# Patient Record
Sex: Female | Born: 1940 | Race: White | Hispanic: No | Smoking: Never smoker
Health system: Southern US, Community
[De-identification: ages and names within clinical notes are randomized; demographics above are authoritative.]

## PROBLEM LIST (undated history)

## (undated) DIAGNOSIS — I4891 Unspecified atrial fibrillation: Secondary | ICD-10-CM

## (undated) DIAGNOSIS — J189 Pneumonia, unspecified organism: Secondary | ICD-10-CM

## (undated) DIAGNOSIS — I499 Cardiac arrhythmia, unspecified: Secondary | ICD-10-CM

## (undated) DIAGNOSIS — I10 Essential (primary) hypertension: Secondary | ICD-10-CM

## (undated) DIAGNOSIS — J45909 Unspecified asthma, uncomplicated: Secondary | ICD-10-CM

---

## 2014-05-26 ENCOUNTER — Emergency Department (HOSPITAL_COMMUNITY)
Admission: EM | Admit: 2014-05-26 | Discharge: 2014-05-26 | Disposition: A | Discharge status: Home / Self Care | Payer: Medicare Other | Attending: Emergency Medicine | Admitting: Emergency Medicine

## 2014-05-26 ENCOUNTER — Emergency Department (HOSPITAL_COMMUNITY): Payer: Medicare Other

## 2014-05-26 ENCOUNTER — Encounter (HOSPITAL_COMMUNITY): Payer: Self-pay | Admitting: Emergency Medicine

## 2014-05-26 DIAGNOSIS — Z79899 Other long term (current) drug therapy: Secondary | ICD-10-CM | POA: Insufficient documentation

## 2014-05-26 DIAGNOSIS — M79671 Pain in right foot: Secondary | ICD-10-CM

## 2014-05-26 DIAGNOSIS — Z880 Allergy status to penicillin: Secondary | ICD-10-CM | POA: Insufficient documentation

## 2014-05-26 DIAGNOSIS — Z7902 Long term (current) use of antithrombotics/antiplatelets: Secondary | ICD-10-CM | POA: Insufficient documentation

## 2014-05-26 DIAGNOSIS — I10 Essential (primary) hypertension: Secondary | ICD-10-CM | POA: Insufficient documentation

## 2014-05-26 DIAGNOSIS — I4891 Unspecified atrial fibrillation: Secondary | ICD-10-CM | POA: Insufficient documentation

## 2014-05-26 DIAGNOSIS — M79609 Pain in unspecified limb: Secondary | ICD-10-CM | POA: Insufficient documentation

## 2014-05-26 HISTORY — DX: Unspecified atrial fibrillation: I48.91

## 2014-05-26 HISTORY — DX: Essential (primary) hypertension: I10

## 2014-05-26 LAB — BASIC METABOLIC PANEL
BUN: 12 mg/dL (ref 6–23)
CO2: 31 mEq/L (ref 19–32)
CREATININE: 0.74 mg/dL (ref 0.50–1.10)
Calcium: 8.7 mg/dL (ref 8.4–10.5)
Chloride: 101 mEq/L (ref 96–112)
GFR, EST NON AFRICAN AMERICAN: 84 mL/min — AB (ref >90)
Glucose, Bld: 98 mg/dL (ref 70–99)
Potassium: 3.9 mEq/L (ref 3.7–5.3)
Sodium: 140 mEq/L (ref 137–147)

## 2014-05-26 LAB — CBC WITH DIFFERENTIAL/PLATELET
BASOS PCT: 0 % (ref 0–1)
Basophils Absolute: 0 10*3/uL (ref 0.0–0.1)
Eosinophils Absolute: 0.1 10*3/uL (ref 0.0–0.7)
Eosinophils Relative: 3 % (ref 0–5)
HCT: 39.8 % (ref 36.0–46.0)
Hemoglobin: 13.3 g/dL (ref 12.0–15.0)
Lymphocytes Relative: 16 % (ref 12–46)
Lymphs Abs: 0.9 10*3/uL (ref 0.7–4.0)
MCH: 31.3 pg (ref 26.0–34.0)
MCHC: 33.4 g/dL (ref 30.0–36.0)
MCV: 93.6 fL (ref 78.0–100.0)
MONO ABS: 0.8 10*3/uL (ref 0.1–1.0)
Monocytes Relative: 15 % — ABNORMAL HIGH (ref 3–12)
NEUTROS ABS: 3.6 10*3/uL (ref 1.7–7.7)
Neutrophils Relative %: 66 % (ref 43–77)
Platelets: 220 10*3/uL (ref 150–400)
RBC: 4.25 MIL/uL (ref 3.87–5.11)
RDW: 14.2 % (ref 11.5–15.5)
WBC: 5.5 10*3/uL (ref 4.0–10.5)

## 2014-05-26 MED ORDER — OXYCODONE-ACETAMINOPHEN 5-325 MG PO TABS
1.0000 | ORAL_TABLET | Freq: Once | ORAL | Status: AC
  Administered 2014-05-26: 1 via ORAL
  Filled 2014-05-26: qty 1

## 2014-05-26 MED ORDER — CLINDAMYCIN HCL 300 MG PO CAPS: 300.0000 mg | ORAL_CAPSULE | Freq: Four times a day (QID) | ORAL | Status: DC

## 2014-05-26 MED ORDER — OXYCODONE-ACETAMINOPHEN 5-325 MG PO TABS
1.0000 | ORAL_TABLET | ORAL | Status: DC | PRN
Start: 2014-05-26 — End: 2019-09-26

## 2014-05-26 NOTE — ED Notes (Addendum)
Patient states she recently drove from [place], two days total of around 20 hours of driving arrived 7 days ago. Right Leg/foot swollen since. Hx of Afib, on blood thinner.  Edema +2, nonpitting. RLE DP pulse weak, doppler confirmed.

## 2014-05-26 NOTE — ED Provider Notes (Signed)
CSN: [id number]     Arrival date & time 05/26/14  [id number] History  This chart was scribed for Joya Gaskinsonald W Wickline, MD by Julian HyMorgan Graham, ED Scribe. The patient was seen in [facility]. The patient's care was started at 10:40 AM.     Chief Complaint  Patient presents with  . Leg Swelling   Patient is a 73 y.o. female presenting with leg pain. The history is provided by the patient. No language interpreter was used.  Leg Pain Location:  Foot Time since incident:  2 days Injury: no   Foot location:  R foot Pain details:    Quality:  Unable to specify   Radiates to:  R leg   Severity:  Moderate   Onset quality:  Sudden   Duration:  2 days   Timing:  Constant Chronicity:  New Dislocation: no   Prior injury to area:  No Worsened by:  Activity and bearing weight Ineffective treatments: Taramodol  Associated symptoms: decreased ROM   Associated symptoms: no muscle weakness and no numbness    HPI Comments: Theresa Fostervelyn Bolon is a 73 y.o. female who presents to the Emergency Department complaining of constant, moderate right foot pain that started 3 days ago. Pt states her pain radiates to her right calf. Pt states her pain is worsened with movement and weight-bearing. Pt states she drove for about 13 hours from [place] to [place] last week. She has past medical history of high blood pressure and has atrial fibrillation. Pt states she tried Tramadol without relief. She currently takes Eliquis 5 MG and is compliant. Pt denies history of DVT/PE. Pt denies blood loss, blood in the stool, chest pain, numbness, weakness and SOB. Pt denies any known allergies.  No trauma reported  Past Medical History  Diagnosis Date  . Atrial fibrillation   . Hypertension    No past surgical history on file. No family history on file. History  Substance Use Topics  . Smoking status: Never Smoker   . Smokeless tobacco: Not on file  . Alcohol Use: Not on file   OB History   Grav Para Term Preterm Abortions TAB  SAB Ect Mult Living                 Review of Systems  Respiratory: Negative for shortness of breath.   Cardiovascular: Negative for chest pain.  Gastrointestinal: Negative for blood in stool.  Musculoskeletal:       Right foot/calf pain  Neurological: Negative for weakness and numbness.  All other systems reviewed and are negative.  Allergies  Penicillins  Home Medications   Prior to Admission medications   Medication Sig Start Date End Date Taking? Authorizing Provider  apixaban (ELIQUIS) 5 MG TABS tablet Take 5 mg by mouth 2 (two) times daily.   Yes Historical Provider, MD  busPIRone (BUSPAR) 15 MG tablet Take 15 mg by mouth daily.   Yes Historical Provider, MD  metoprolol succinate (TOPROL-XL) 50 MG 24 hr tablet Take 50 mg by mouth 2 (two) times daily. Take with or immediately following a meal.   Yes Historical Provider, MD  Multiple Vitamins-Minerals (OCUVITE PRESERVISION) TABS Take 1 tablet by mouth daily.   Yes Historical Provider, MD  pantoprazole (PROTONIX) 40 MG tablet Take 40 mg by mouth daily.   Yes Historical Provider, MD  raloxifene (EVISTA) 60 MG tablet Take 60 mg by mouth daily.   Yes Historical Provider, MD  sertraline (ZOLOFT) 100 MG tablet Take 100 mg by mouth daily.  Yes Historical Provider, MD  traZODone (DESYREL) 150 MG tablet Take by mouth at bedtime.   Yes Historical Provider, MD   Triage Vitals: BP 140/77  Pulse 65  Temp(Src) 98.2 F (36.8 C) (Oral)  Ht 5\' 3"  (1.6 m)  Wt 179 lb (81.194 kg)  BMI 31.72 kg/m2  SpO2 96%  Physical Exam CONSTITUTIONAL: Well developed/well nourished HEAD: Normocephalic/atraumatic EYES: EOMI/PERRL ENMT: Mucous membranes moist NECK: supple no meningeal signs SPINE:entire spine nontender CV:  no murmurs/rubs/gallops noted LUNGS: Lungs are clear to auscultation bilaterally, no apparent distress ABDOMEN: soft, nontender, no rebound or guarding GU:no cva tenderness NEURO: Pt is awake/alert, moves all  extremitiesx4 EXTREMITIES: pulses normal, full ROM, distal pulses equal and intact, mild right calf tenderness, mild tenderness to dorsal and plantar aspect of right foot, no erythema noted SKIN: warm, color normal PSYCH: no abnormalities of mood noted  .  ED Course  Procedures  DIAGNOSTIC STUDIES: Oxygen Saturation is 96% on RA, adequate by my interpretation.    COORDINATION OF CARE: 10:44 AM- Will order Percocet and ultrasound of leg..nt plan for treatment and evaluation and agrees with plan at this time.  Pt improved Pain mostly to dorsal/plantar aspect of right foot DVT study negative Distal pulses equal/intact in both feet She does have mild erythema to dorsal aspect of foot, but no streaking.  She may be developing cellulitis Will give oral clindamycin.  However I told her to start only if erythema worsens (pt here visiting family, no PCP locally)   Labs Review Labs Reviewed  BASIC METABOLIC PANEL - Abnormal; Notable for the following:    GFR calc non Af Amer 84 (*)    All other components within normal limits  CBC WITH DIFFERENTIAL - Abnormal; Notable for the following:    Monocytes Relative 15 (*)    All other components within normal limits    Imaging Review [place] Venous Img Lower Unilateral Right  05/26/2014   CLINICAL DATA:  Right lower extremity edema and pain.  EXAM: RIGHT LOWER EXTREMITY VENOUS DOPPLER ULTRASOUND  TECHNIQUE: Gray-scale sonography with graded compression, as well as color Doppler and duplex ultrasound were performed to evaluate the lower extremity deep venous systems from the level of the common femoral vein and including the common femoral, femoral, profunda femoral, popliteal and calf veins including the posterior tibial, peroneal and gastrocnemius veins when visible. The superficial great saphenous vein was also interrogated. Spectral Doppler was utilized to evaluate flow at rest and with distal augmentation maneuvers in the common femoral, femoral and  popliteal veins.  COMPARISON:  None.  FINDINGS: Common Femoral Vein: No evidence of thrombus. Normal compressibility, respiratory phasicity and response to augmentation.  Saphenofemoral Junction: No evidence of thrombus. Normal compressibility and flow on color Doppler imaging.  Profunda Femoral Vein: No evidence of thrombus. Normal compressibility and flow on color Doppler imaging.  Femoral Vein: No evidence of thrombus. Normal compressibility, respiratory phasicity and response to augmentation.  Popliteal Vein: No evidence of thrombus. Normal compressibility, respiratory phasicity and response to augmentation.  Calf Veins: No evidence of thrombus. Normal compressibility and flow on color Doppler imaging.  Superficial Great Saphenous Vein: No evidence of thrombus. Normal compressibility and flow on color Doppler imaging.  Venous Reflux:  None.  Other Findings: No evidence of superficial thrombophlebitis or abnormal fluid collection.  IMPRESSION: No evidence of right lower extremity deep venous thrombosis.   Electronically Signed   By: Irish LackGlenn  Yamagata M.D.   On: 05/26/2014 12:18      MDM  Final diagnoses:  Pain in right foot    Nursing notes including past medical history and social history reviewed and considered in documentation Labs/vital reviewed and considered   I personally performed the services described in this documentation, which was scribed in my presence. The recorded information has been reviewed and is accurate.       Joya Gaskins, MD 05/26/14 1501

## 2018-09-09 ENCOUNTER — Emergency Department (HOSPITAL_COMMUNITY): Payer: Medicare Other

## 2018-09-09 ENCOUNTER — Emergency Department (HOSPITAL_COMMUNITY)
Admission: EM | Admit: 2018-09-09 | Discharge: 2018-09-09 | Disposition: A | Discharge status: Home / Self Care | Payer: Medicare Other | Attending: Emergency Medicine | Admitting: Emergency Medicine

## 2018-09-09 ENCOUNTER — Other Ambulatory Visit: Payer: Self-pay

## 2018-09-09 ENCOUNTER — Encounter (HOSPITAL_COMMUNITY): Payer: Self-pay

## 2018-09-09 DIAGNOSIS — W109XXA Fall (on) (from) unspecified stairs and steps, initial encounter: Secondary | ICD-10-CM | POA: Diagnosis not present

## 2018-09-09 DIAGNOSIS — S99922A Unspecified injury of left foot, initial encounter: Secondary | ICD-10-CM | POA: Diagnosis present

## 2018-09-09 DIAGNOSIS — S93602A Unspecified sprain of left foot, initial encounter: Secondary | ICD-10-CM | POA: Insufficient documentation

## 2018-09-09 DIAGNOSIS — I4891 Unspecified atrial fibrillation: Secondary | ICD-10-CM | POA: Diagnosis not present

## 2018-09-09 DIAGNOSIS — I10 Essential (primary) hypertension: Secondary | ICD-10-CM | POA: Insufficient documentation

## 2018-09-09 DIAGNOSIS — Y939 Activity, unspecified: Secondary | ICD-10-CM | POA: Insufficient documentation

## 2018-09-09 DIAGNOSIS — Y998 Other external cause status: Secondary | ICD-10-CM | POA: Insufficient documentation

## 2018-09-09 DIAGNOSIS — Y929 Unspecified place or not applicable: Secondary | ICD-10-CM | POA: Insufficient documentation

## 2018-09-09 HISTORY — DX: Pneumonia, unspecified organism: J18.9

## 2018-09-09 MED ORDER — HYDROCODONE-ACETAMINOPHEN 5-325 MG PO TABS
1.0000 | ORAL_TABLET | Freq: Once | ORAL | Status: AC
  Administered 2018-09-09: 1 via ORAL
  Filled 2018-09-09: qty 1

## 2018-09-09 MED ORDER — HYDROCODONE-ACETAMINOPHEN 5-325 MG PO TABS: 1.0000 | ORAL_TABLET | Freq: Four times a day (QID) | ORAL | 0 refills | Status: DC | PRN

## 2018-09-09 NOTE — ED Provider Notes (Signed)
[facility] EMERGENCY DEPARTMENT Provider Note   CSN: [id number] Arrival date & time: 09/09/18  1300     History   Chief Complaint Chief Complaint  Patient presents with  . Foot Pain    HPI Theresa Wong is a 77 y.o. female.  HPI Patient presents with left foot pain.  2 days ago stepped off what she thought was a last step but there was one more.  Pain in her midfoot since then.  No other injury.  She is on Eliquis for atrial fibrillation.  Did not hit her head. Past Medical History:  Diagnosis Date  . Atrial fibrillation (HCC)   . Hypertension   . Pneumonia     There are no active problems to display for this patient.   History reviewed. No pertinent surgical history.   OB History   None      Home Medications    Prior to Admission medications   Medication Sig Start Date End Date Taking? Authorizing Provider  apixaban (ELIQUIS) 5 MG TABS tablet Take 5 mg by mouth 2 (two) times daily.    [provider]  busPIRone (BUSPAR) 15 MG tablet Take 15 mg by mouth daily.    [provider]  clindamycin (CLEOCIN) 300 MG capsule Take 1 capsule (300 mg total) by mouth 4 (four) times daily. X 7 days 05/26/14   Zadie Rhine, MD  HYDROcodone-acetaminophen (NORCO/VICODIN) 5-325 MG tablet Take 1 tablet by mouth every 6 (six) hours as needed. 09/09/18   Benjiman Core, MD  metoprolol succinate (TOPROL-XL) 50 MG 24 hr tablet Take 50 mg by mouth 2 (two) times daily. Take with or immediately following a meal.    [provider]  Multiple Vitamins-Minerals (OCUVITE PRESERVISION) TABS Take 1 tablet by mouth daily.    [provider]  oxyCODONE-acetaminophen (PERCOCET/ROXICET) 5-325 MG per tablet Take 1 tablet by mouth every 4 (four) hours as needed for severe pain. 05/26/14   Zadie Rhine, MD  pantoprazole (PROTONIX) 40 MG tablet Take 40 mg by mouth daily.    [provider]  raloxifene (EVISTA) 60 MG tablet Take 60 mg by mouth daily.     [provider]  sertraline (ZOLOFT) 100 MG tablet Take 100 mg by mouth daily.    [provider]  traZODone (DESYREL) 150 MG tablet Take by mouth at bedtime.    [provider]    Family History No family history on file.  Social History Social History   Tobacco Use  . Smoking status: Never Smoker  Substance Use Topics  . Alcohol use: Yes    Comment: occassional   . Drug use: Never     Allergies   Penicillins   Review of Systems Review of Systems  Constitutional: Negative for fever.  Respiratory: Negative for shortness of breath.   Genitourinary: Negative for flank pain.  Musculoskeletal: Negative for back pain.       Left foot pain.      Physical Exam Updated Vital Signs BP 129/63 (BP Location: Right Arm)   Pulse 71   Temp 98.3 F (36.8 C) (Oral)   Resp 18   Wt 83 kg   SpO2 94%   BMI 32.42 kg/m   Physical Exam  Constitutional: She appears well-developed.  Cardiovascular: Normal rate.  Musculoskeletal: She exhibits tenderness.  No tenderness over ankle or lower leg.  Tenderness over the midfoot on the left side.  Good capillary refill.  Sensation grossly intact.  No tenderness over hip.  Skin:  Skin is warm. Capillary refill takes less than 2 seconds.     ED Treatments / Results  Labs (all labs ordered are listed, but only abnormal results are displayed) Labs Reviewed - No data to display  EKG None  Radiology Dg Foot Complete Left  Result Date: 09/09/2018 CLINICAL DATA:  Left foot pain after injury 2 days ago. EXAM: LEFT FOOT - COMPLETE 3+ VIEW COMPARISON:  None. FINDINGS: There is no evidence of fracture or dislocation. 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 Surgical screw is noted in the distal left first metatarsal. Soft tissues are unremarkable. IMPRESSION: Postsurgical and degenerative changes as described above. No acute abnormality seen in the left foot.  Electronically Signed   By: Lupita Raider, M.D.   On: 09/09/2018 14:58    Procedures Procedures (including critical care time)  Medications Ordered in ED Medications - No data to display   Initial Impression / Assessment and Plan / ED Course  I have reviewed the triage vital signs and the nursing notes.  Pertinent labs & imaging results that were available during my care of the patient were reviewed by me and considered in my medical decision making (see chart for details).     Patient with tenderness over left midfoot.  X-ray shows chronic changes.  Will give [facility] and Ortho follow-up.  Also short course of pain medicine.  Drug database reviewed both here and in [place].  Final Clinical Impressions(s) / ED Diagnoses   Final diagnoses:  Sprain of left foot, initial encounter    ED Discharge Orders         Ordered    HYDROcodone-acetaminophen (NORCO/VICODIN) 5-325 MG tablet  Every 6 hours PRN     09/09/18 1516           Benjiman Core, MD 09/09/18 1520

## 2018-09-09 NOTE — ED Triage Notes (Signed)
Pt reports that she missed off one step and landed very hard on left foot. Incident occured on Saturday. Pt noted to have swelling and increase pain since last night

## 2019-09-26 ENCOUNTER — Encounter (HOSPITAL_COMMUNITY): Payer: Self-pay

## 2019-09-26 ENCOUNTER — Other Ambulatory Visit: Payer: Self-pay

## 2019-09-26 ENCOUNTER — Ambulatory Visit
Admission: EM | Admit: 2019-09-26 | Discharge: 2019-09-26 | Disposition: A | Discharge status: Home / Self Care | Payer: Medicare (Managed Care) | Source: Home / Self Care

## 2019-09-26 ENCOUNTER — Emergency Department (HOSPITAL_COMMUNITY): Payer: Medicare (Managed Care)

## 2019-09-26 ENCOUNTER — Ambulatory Visit (INDEPENDENT_AMBULATORY_CARE_PROVIDER_SITE_OTHER): Payer: Medicare Other

## 2019-09-26 ENCOUNTER — Inpatient Hospital Stay (HOSPITAL_COMMUNITY)
Admission: EM | Admit: 2019-09-26 | Discharge: 2019-09-28 | DRG: 603 | Disposition: A | Discharge status: Home / Self Care | Payer: Medicare (Managed Care) | Attending: Internal Medicine | Admitting: Internal Medicine

## 2019-09-26 DIAGNOSIS — F419 Anxiety disorder, unspecified: Secondary | ICD-10-CM | POA: Diagnosis present

## 2019-09-26 DIAGNOSIS — I959 Hypotension, unspecified: Secondary | ICD-10-CM | POA: Diagnosis present

## 2019-09-26 DIAGNOSIS — E876 Hypokalemia: Secondary | ICD-10-CM | POA: Diagnosis present

## 2019-09-26 DIAGNOSIS — Z20828 Contact with and (suspected) exposure to other viral communicable diseases: Secondary | ICD-10-CM | POA: Diagnosis present

## 2019-09-26 DIAGNOSIS — Z79899 Other long term (current) drug therapy: Secondary | ICD-10-CM | POA: Diagnosis not present

## 2019-09-26 DIAGNOSIS — R2231 Localized swelling, mass and lump, right upper limb: Secondary | ICD-10-CM | POA: Diagnosis not present

## 2019-09-26 DIAGNOSIS — Z8249 Family history of ischemic heart disease and other diseases of the circulatory system: Secondary | ICD-10-CM | POA: Diagnosis not present

## 2019-09-26 DIAGNOSIS — I10 Essential (primary) hypertension: Secondary | ICD-10-CM

## 2019-09-26 DIAGNOSIS — I4891 Unspecified atrial fibrillation: Secondary | ICD-10-CM | POA: Diagnosis not present

## 2019-09-26 DIAGNOSIS — L03113 Cellulitis of right upper limb: Principal | ICD-10-CM | POA: Diagnosis present

## 2019-09-26 DIAGNOSIS — M25531 Pain in right wrist: Secondary | ICD-10-CM

## 2019-09-26 DIAGNOSIS — F329 Major depressive disorder, single episode, unspecified: Secondary | ICD-10-CM | POA: Diagnosis present

## 2019-09-26 DIAGNOSIS — M25432 Effusion, left wrist: Secondary | ICD-10-CM

## 2019-09-26 DIAGNOSIS — N179 Acute kidney failure, unspecified: Secondary | ICD-10-CM | POA: Diagnosis present

## 2019-09-26 DIAGNOSIS — I480 Paroxysmal atrial fibrillation: Secondary | ICD-10-CM | POA: Diagnosis present

## 2019-09-26 DIAGNOSIS — Z7901 Long term (current) use of anticoagulants: Secondary | ICD-10-CM

## 2019-09-26 DIAGNOSIS — L0390 Cellulitis, unspecified: Secondary | ICD-10-CM

## 2019-09-26 DIAGNOSIS — K219 Gastro-esophageal reflux disease without esophagitis: Secondary | ICD-10-CM | POA: Diagnosis present

## 2019-09-26 DIAGNOSIS — M25532 Pain in left wrist: Secondary | ICD-10-CM | POA: Diagnosis not present

## 2019-09-26 HISTORY — DX: Unspecified asthma, uncomplicated: J45.909

## 2019-09-26 HISTORY — DX: Cardiac arrhythmia, unspecified: I49.9

## 2019-09-26 LAB — COMPREHENSIVE METABOLIC PANEL
ALT: 12 U/L (ref 0–44)
AST: 16 U/L (ref 15–41)
Albumin: 2.8 g/dL — ABNORMAL LOW (ref 3.5–5.0)
Alkaline Phosphatase: 46 U/L (ref 38–126)
Anion gap: 11 (ref 5–15)
BUN: 19 mg/dL (ref 8–23)
CO2: 28 mmol/L (ref 22–32)
Calcium: 8.6 mg/dL — ABNORMAL LOW (ref 8.9–10.3)
Chloride: 98 mmol/L (ref 98–111)
Creatinine, Ser: 1.14 mg/dL — ABNORMAL HIGH (ref 0.44–1.00)
GFR calc Af Amer: 54 mL/min — ABNORMAL LOW (ref >60)
GFR calc non Af Amer: 47 mL/min — ABNORMAL LOW (ref >60)
Glucose, Bld: 87 mg/dL (ref 70–99)
Potassium: 2.9 mmol/L — ABNORMAL LOW (ref 3.5–5.1)
Sodium: 137 mmol/L (ref 135–145)
Total Bilirubin: 0.8 mg/dL (ref 0.3–1.2)
Total Protein: 6.5 g/dL (ref 6.5–8.1)

## 2019-09-26 LAB — LACTIC ACID, PLASMA: Lactic Acid, Venous: 1.4 mmol/L (ref 0.5–1.9)

## 2019-09-26 LAB — CBC WITH DIFFERENTIAL/PLATELET
Abs Immature Granulocytes: 0.03 10*3/uL (ref 0.00–0.07)
Basophils Absolute: 0 10*3/uL (ref 0.0–0.1)
Basophils Relative: 0 %
Eosinophils Absolute: 0 10*3/uL (ref 0.0–0.5)
Eosinophils Relative: 0 %
HCT: 36.6 % (ref 36.0–46.0)
Hemoglobin: 11.8 g/dL — ABNORMAL LOW (ref 12.0–15.0)
Immature Granulocytes: 0 %
Lymphocytes Relative: 9 %
Lymphs Abs: 0.7 10*3/uL (ref 0.7–4.0)
MCH: 31.3 pg (ref 26.0–34.0)
MCHC: 32.2 g/dL (ref 30.0–36.0)
MCV: 97.1 fL (ref 80.0–100.0)
Monocytes Absolute: 1.3 10*3/uL — ABNORMAL HIGH (ref 0.1–1.0)
Monocytes Relative: 18 %
Neutro Abs: 5.1 10*3/uL (ref 1.7–7.7)
Neutrophils Relative %: 73 %
Platelets: 222 10*3/uL (ref 150–400)
RBC: 3.77 MIL/uL — ABNORMAL LOW (ref 3.87–5.11)
RDW: 13.8 % (ref 11.5–15.5)
WBC: 7.1 10*3/uL (ref 4.0–10.5)
nRBC: 0 % (ref 0.0–0.2)

## 2019-09-26 LAB — PROTIME-INR
INR: 1.9 — ABNORMAL HIGH (ref 0.8–1.2)
Prothrombin Time: 21.4 seconds — ABNORMAL HIGH (ref 11.4–15.2)

## 2019-09-26 LAB — PROCALCITONIN: Procalcitonin: <0.1 ng/mL

## 2019-09-26 LAB — APTT: aPTT: 53 seconds — ABNORMAL HIGH (ref 24–36)

## 2019-09-26 LAB — SARS CORONAVIRUS 2 (TAT 6-24 HRS): SARS Coronavirus 2: NEGATIVE

## 2019-09-26 MED ORDER — VANCOMYCIN HCL 1.25 G IV SOLR
1250.0000 mg | INTRAVENOUS | Status: DC
  Administered 2019-09-27: 1250 mg via INTRAVENOUS
  Filled 2019-09-26 (×2): qty 1250

## 2019-09-26 MED ORDER — SODIUM CHLORIDE 0.9 % IV SOLN
INTRAVENOUS | Status: DC
  Administered 2019-09-26: 20:00:00 via INTRAVENOUS

## 2019-09-26 MED ORDER — APIXABAN 5 MG PO TABS
5.0000 mg | ORAL_TABLET | Freq: Two times a day (BID) | ORAL | Status: DC
  Administered 2019-09-26 – 2019-09-28 (×4): 5 mg via ORAL
  Filled 2019-09-26 (×4): qty 1

## 2019-09-26 MED ORDER — PANTOPRAZOLE SODIUM 40 MG PO TBEC
40.0000 mg | DELAYED_RELEASE_TABLET | Freq: Every day | ORAL | Status: DC
  Administered 2019-09-27 – 2019-09-28 (×2): 40 mg via ORAL
  Filled 2019-09-26 (×2): qty 1

## 2019-09-26 MED ORDER — SODIUM CHLORIDE 0.9 % IV SOLN
1.0000 g | Freq: Two times a day (BID) | INTRAVENOUS | Status: DC
  Administered 2019-09-26: 1 g via INTRAVENOUS
  Filled 2019-09-26 (×3): qty 1

## 2019-09-26 MED ORDER — POTASSIUM CHLORIDE CRYS ER 20 MEQ PO TBCR
40.0000 meq | EXTENDED_RELEASE_TABLET | Freq: Four times a day (QID) | ORAL | Status: AC
  Administered 2019-09-26 (×2): 40 meq via ORAL
  Filled 2019-09-26 (×2): qty 2

## 2019-09-26 MED ORDER — SODIUM CHLORIDE 0.9 % IV SOLN
1000.0000 mL | INTRAVENOUS | Status: DC
  Administered 2019-09-26: 1000 mL via INTRAVENOUS

## 2019-09-26 MED ORDER — VANCOMYCIN HCL IN DEXTROSE 1-5 GM/200ML-% IV SOLN: 1000.0000 mg | Freq: Once | INTRAVENOUS | Status: DC

## 2019-09-26 MED ORDER — ACETAMINOPHEN 325 MG PO TABS
650.0000 mg | ORAL_TABLET | Freq: Four times a day (QID) | ORAL | Status: DC | PRN
  Administered 2019-09-28: 650 mg via ORAL
  Filled 2019-09-26: qty 2

## 2019-09-26 MED ORDER — ACETAMINOPHEN 650 MG RE SUPP: 650.0000 mg | Freq: Four times a day (QID) | RECTAL | Status: DC | PRN

## 2019-09-26 MED ORDER — COLCHICINE 0.6 MG PO TABS
1.2000 mg | ORAL_TABLET | Freq: Once | ORAL | Status: AC
  Administered 2019-09-26: 1.2 mg via ORAL
  Filled 2019-09-26: qty 2

## 2019-09-26 MED ORDER — LIDOCAINE-EPINEPHRINE (PF) 1 %-1:200000 IJ SOLN
10.0000 mL | Freq: Once | INTRAMUSCULAR | Status: AC
  Administered 2019-09-26: 10 mL
  Filled 2019-09-26: qty 30

## 2019-09-26 MED ORDER — TRAZODONE HCL 50 MG PO TABS: 50.0000 mg | ORAL_TABLET | Freq: Once | ORAL | Status: DC

## 2019-09-26 MED ORDER — TRAZODONE HCL 50 MG PO TABS
150.0000 mg | ORAL_TABLET | Freq: Once | ORAL | Status: AC
  Administered 2019-09-26: 150 mg via ORAL
  Filled 2019-09-26: qty 3

## 2019-09-26 MED ORDER — PROSIGHT PO TABS
1.0000 | ORAL_TABLET | Freq: Every day | ORAL | Status: DC
  Filled 2019-09-26 (×3): qty 1

## 2019-09-26 MED ORDER — VANCOMYCIN HCL 10 G IV SOLR
1500.0000 mg | Freq: Once | INTRAVENOUS | Status: AC
  Administered 2019-09-26: 1500 mg via INTRAVENOUS
  Filled 2019-09-26: qty 1500

## 2019-09-26 MED ORDER — OXYCODONE-ACETAMINOPHEN 5-325 MG PO TABS
1.0000 | ORAL_TABLET | ORAL | Status: DC | PRN
  Administered 2019-09-26 – 2019-09-28 (×8): 1 via ORAL
  Filled 2019-09-26 (×8): qty 1

## 2019-09-26 MED ORDER — RALOXIFENE HCL 60 MG PO TABS
60.0000 mg | ORAL_TABLET | Freq: Every day | ORAL | Status: DC
  Administered 2019-09-27 – 2019-09-28 (×2): 60 mg via ORAL
  Filled 2019-09-26 (×2): qty 1

## 2019-09-26 NOTE — ED Provider Notes (Signed)
[facility] EMERGENCY DEPARTMENT Provider Note   CSN: [id number] Arrival date & time: 09/26/19  1156     History   Chief Complaint Chief Complaint  Patient presents with  . Wrist Pain    HPI Theresa Wong is a 78 y.o. female.     HPI  This patient is a 78 year old female, she is not a diabetic, she comes in from the urgent care after having persistent pain and swelling of her right arm.  She reports that this has become gradually worsened over the last several days despite the use of clindamycin as prescribed by a clinic where she was initially seen.  The pain was initially at the wrist but now feels like the redness and pain is tracking up the forearm all the way to the elbow.  She has significant pain with any range of motion of the elbow or the wrist.  She has no fever but states that she is having night sweats, she is feeling dizzy and lightheaded.  She was noted to be hypotensive in the urgent care and was sent to the emergency department for further evaluation.  Past Medical History:  Diagnosis Date  . Atrial fibrillation ([place])   . Hypertension   . Pneumonia     There are no active problems to display for this patient.   History reviewed. No pertinent surgical history.   OB History   No obstetric history on file.      Home Medications    Prior to Admission medications   Medication Sig Start Date End Date Taking? Authorizing Provider  amLODipine (NORVASC) 5 MG tablet Take 5 mg by mouth daily.    [provider]  apixaban (ELIQUIS) 5 MG TABS tablet Take 5 mg by mouth 2 (two) times daily.    [provider]  busPIRone (BUSPAR) 15 MG tablet Take 15 mg by mouth daily.    [provider]  clindamycin (CLEOCIN) 300 MG capsule Take 1 capsule (300 mg total) by mouth 4 (four) times daily. X 7 days 05/26/14   Ripley Fraise, MD  hydrochlorothiazide (MICROZIDE) 12.5 MG capsule Take 12.5 mg by mouth daily.    [provider]   HYDROcodone-acetaminophen (NORCO/VICODIN) 5-325 MG tablet Take 1 tablet by mouth every 6 (six) hours as needed. 09/09/18   Davonna Belling, MD  metoprolol succinate (TOPROL-XL) 50 MG 24 hr tablet Take 50 mg by mouth 2 (two) times daily. Take with or immediately following a meal.    [provider]  Multiple Vitamins-Minerals (OCUVITE PRESERVISION) TABS Take 1 tablet by mouth daily.    [provider]  oxyCODONE-acetaminophen (PERCOCET/ROXICET) 5-325 MG per tablet Take 1 tablet by mouth every 4 (four) hours as needed for severe pain. 05/26/14   Ripley Fraise, MD  pantoprazole (PROTONIX) 40 MG tablet Take 40 mg by mouth daily.    [provider]  raloxifene (EVISTA) 60 MG tablet Take 60 mg by mouth daily.    [provider]  sertraline (ZOLOFT) 100 MG tablet Take 100 mg by mouth daily.    [provider]  STUDY - ASPIRE - apixaban 5 mg or placebo tablet (PI-Sethi) Take 5 mg by mouth daily.    [provider]  traZODone (DESYREL) 150 MG tablet Take by mouth at bedtime.    [provider]    Family History No family history on file.  Social History Social History   Tobacco Use  . Smoking status: Never Smoker  Substance Use Topics  .  Alcohol use: Yes    Comment: occassional   . Drug use: Never     Allergies   Penicillins   Review of Systems Review of Systems  All other systems reviewed and are negative.    Physical Exam Updated Vital Signs BP (!) 92/55 (BP Location: Left Arm)   Pulse (!) 55   Temp (!) 96.7 F (35.9 C) (Temporal)   Resp 15   Ht 1.613 m (5' 3.5")   Wt 73.5 kg   SpO2 97%   BMI 28.25 kg/m   Physical Exam Vitals signs and nursing note reviewed.  Constitutional:      General: She is not in acute distress.    Appearance: She is well-developed.  HENT:     Head: Normocephalic and atraumatic.     Mouth/Throat:     Pharynx: No oropharyngeal exudate.  Eyes:     General: No scleral icterus.        Right eye: No discharge.        Left eye: No discharge.     Conjunctiva/sclera: Conjunctivae normal.     Pupils: Pupils are equal, round, and reactive to light.  Neck:     Musculoskeletal: Normal range of motion and neck supple.     Thyroid: No thyromegaly.     Vascular: No JVD.  Cardiovascular:     Rate and Rhythm: Normal rate and regular rhythm.     Heart sounds: Normal heart sounds. No murmur. No friction rub. No gallop.   Pulmonary:     Effort: Pulmonary effort is normal. No respiratory distress.     Breath sounds: Normal breath sounds. No wheezing or rales.  Abdominal:     General: Bowel sounds are normal. There is no distension.     Palpations: Abdomen is soft. There is no mass.     Tenderness: There is no abdominal tenderness.  Musculoskeletal: Normal range of motion.        General: Swelling and tenderness present. No deformity.     Comments: Significant pain with range of motion of the elbow and the wrist on the right  Lymphadenopathy:     Cervical: No cervical adenopathy.  Skin:    General: Skin is warm and dry.     Findings: Erythema and rash present.     Comments: There is joint swelling of the right wrist and the right elbow with a slight effusion of the right elbow, there is erythema tracking up the arm from the dorsum of the hand to the palm of the hand and onto the forearm extending several centimeters beyond the black mark that had been drawn 2 days ago  Neurological:     Mental Status: She is alert.     Coordination: Coordination normal.  Psychiatric:        Behavior: Behavior normal.      ED Treatments / Results  Labs (all labs ordered are listed, but only abnormal results are displayed) Labs Reviewed  CBC WITH DIFFERENTIAL/PLATELET - Abnormal; Notable for the following components:      Result Value   RBC 3.77 (*)    Hemoglobin 11.8 (*)    Monocytes Absolute 1.3 (*)    All other components within normal limits  APTT - Abnormal; Notable for the  following components:   aPTT 53 (*)    All other components within normal limits  PROTIME-INR - Abnormal; Notable for the following components:   Prothrombin Time 21.4 (*)    INR 1.9 (*)  All other components within normal limits  CULTURE, BLOOD (ROUTINE X 2)  CULTURE, BLOOD (ROUTINE X 2)  URINE CULTURE  CSF CULTURE  SARS CORONAVIRUS 2 (TAT 6-24 HRS)  COMPREHENSIVE METABOLIC PANEL  LACTIC ACID, PLASMA  URINALYSIS, ROUTINE W REFLEX MICROSCOPIC  SYNOVIAL CELL COUNT + DIFF, W/ CRYSTALS    EKG EKG Interpretation  Date/Time:  Friday September 26 2019 14:24:18 EDT Ventricular Rate:  110 PR Interval:    QRS Duration: 93 QT Interval:  380 QTC Calculation: 383 R Axis:   52 Text Interpretation: Atrial fibrillation No old tracing to compare Confirmed by Eber HongMiller, Naszir Cott ([id number]) on 09/26/2019 2:32:54 PM   Radiology Dg Wrist Complete Right  Result Date: 09/26/2019 CLINICAL DATA:  Right wrist pain since Monday. Swelling and redness. EXAM: RIGHT WRIST - COMPLETE 3+ VIEW COMPARISON:  None. FINDINGS: Degenerative changes mainly at the scaphoid articulation with the trapezoid and trapezium. No acute bony findings or destructive bony changes. Chondrocalcinosis is noted. The MCP joints are maintained. IMPRESSION: Degenerative changes and chondrocalcinosis but no acute bony findings. Electronically Signed   By: Rudie MeyerP.  Gallerani M.D.   On: 09/26/2019 11:19   Dg Chest Port 1 View  Result Date: 09/26/2019 CLINICAL DATA:  Per triage note: Pt sent to ED by Urgent Care in [place]. Pt was seen on Wednesday for right hand and wrist pain, placed on antibiotics. Pt followed up today bc redness and swelling was getting worse and sent here. Pt had xray today at Urgent Care. EXAM: PORTABLE CHEST 1 VIEW COMPARISON:  None. FINDINGS: Cardiac silhouette is mildly enlarged. No mediastinal or hilar masses. Linear and mild hazy opacities are noted in the right mid lung. Small linear opacities are noted at the left lung  base. These areas are likely due to scarring. No convincing pneumonia. No evidence of pulmonary edema. No pleural effusion or pneumothorax. There are multiple vascular clips over the anterolateral chest falls and axilla consistent with bilateral breast surgery. Skeletal structures are grossly intact. IMPRESSION: No acute cardiopulmonary disease. Electronically Signed   By: Amie Portlandavid  Ormond M.D.   On: 09/26/2019 14:45    Procedures Procedures (including critical care time)  Medications Ordered in ED Medications  0.9 %  sodium chloride infusion (1,000 mLs Intravenous New Bag/Given 09/26/19 1453)  vancomycin (VANCOCIN) 1,500 mg in sodium chloride 0.9 % 500 mL IVPB (1,500 mg Intravenous New Bag/Given 09/26/19 1506)  colchicine tablet 1.2 mg (has no administration in time range)  lidocaine-EPINEPHrine (XYLOCAINE-EPINEPHrine) 1 %-1:200000 (PF) injection 10 mL (10 mLs Other Given 09/26/19 1455)     Initial Impression / Assessment and Plan / ED Course  I have reviewed the triage vital signs and the nursing notes.  Pertinent labs & imaging results that were available during my care of the patient were reviewed by me and considered in my medical decision making (see chart for details).        The patient is hypotensive, 92/55, temperature is low at 96.7, she does have spreading redness up her arm which could be consistent with a cellulitis, labs will be ordered, will attempt to get some fluid from the synovium of the elbow.  Vancomycin has been added, the patient will likely need to be admitted to the hospital.  Labs show no leukocytosis, her blood pressure is improved with fluids and vancomycin, the patient has been reexamined when she has come out of the hallway and into her room with better lighting and the redness seems to be more aggressive than originally anticipated.  Arthrocentesis  yielded less than 1 cc of fluid, this will be sent for culture and Gram stain and possibly cell counts with crystal  analysis though I doubt there is enough volume to do that.  We will consult with hospitalist for admission.  Discussed with hospitalist, they will admit.  Final Clinical Impressions(s) / ED Diagnoses   Final diagnoses:  Cellulitis of right wrist    ED Discharge Orders    None       Eber Hong, MD 09/26/19 1551

## 2019-09-26 NOTE — Discharge Instructions (Signed)
Recommending further evaluation and management in the ED.  Cannot determine cause of left wrist pain, swelling, skin changes, or hypotension.  Patient does not have history of hypotension, and last blood pressure on file was 129/63.  X-rays did not show fracture or dislocation.  Patient has been taking tramadol and clindamycin x 2 days without relief.  Concern for septic joint.  Patient in agreement with plan and will go to ED for further evaluation and management.

## 2019-09-26 NOTE — ED Provider Notes (Signed)
[place]   [id number] 09/26/19 Arrival Time: 1030  CC: Right wrist pain  SUBJECTIVE: History from: patient. Theresa Wong is a 78 y.o. female hx significant atrial fibrillation, and HTN, complains of right wrist pain and swelling that began 4 days ago.  Denies a precipitating event or specific injury. Speculates she may have slept on it wrong, or an insect may have bit her.  Symptoms initially started in RT hand, now has spread to RT wrist and up towards her RT elbow.  Localizes the pain to the inside of RT wirst.  Describes the pain as worsening, intermittent and achy/ throbbing in character.  Symptoms are made worse with wrist ROM.  Was seen an another urgent care 2 days ago and treated with clindamycin and tramadol without relief.  Complains of swelling, skin changes, and warmth to the touch.  Denies fever, chills, erythema, ecchymosis, weakness, numbness and tingling.   ROS: As per HPI.  All other pertinent ROS negative.     Past Medical History:  Diagnosis Date  . Atrial fibrillation ([place])   . Hypertension   . Pneumonia    History reviewed. No pertinent surgical history. Allergies  Allergen Reactions  . Penicillins Itching   No current facility-administered medications on file prior to encounter.    Current Outpatient Medications on File Prior to Encounter  Medication Sig Dispense Refill  . hydrochlorothiazide (MICROZIDE) 12.5 MG capsule Take 12.5 mg by mouth daily.    Marland Kitchen amLODipine (NORVASC) 5 MG tablet Take 5 mg by mouth daily.    Marland Kitchen apixaban (ELIQUIS) 5 MG TABS tablet Take 5 mg by mouth 2 (two) times daily.    . busPIRone (BUSPAR) 15 MG tablet Take 15 mg by mouth daily.    . clindamycin (CLEOCIN) 300 MG capsule Take 1 capsule (300 mg total) by mouth 4 (four) times daily. X 7 days 28 capsule 0  . HYDROcodone-acetaminophen (NORCO/VICODIN) 5-325 MG tablet Take 1 tablet by mouth every 6 (six) hours as needed. 6 tablet 0  . metoprolol succinate (TOPROL-XL) 50 MG 24 hr  tablet Take 50 mg by mouth 2 (two) times daily. Take with or immediately following a meal.    . Multiple Vitamins-Minerals (OCUVITE PRESERVISION) TABS Take 1 tablet by mouth daily.    Marland Kitchen oxyCODONE-acetaminophen (PERCOCET/ROXICET) 5-325 MG per tablet Take 1 tablet by mouth every 4 (four) hours as needed for severe pain. 15 tablet 0  . pantoprazole (PROTONIX) 40 MG tablet Take 40 mg by mouth daily.    . raloxifene (EVISTA) 60 MG tablet Take 60 mg by mouth daily.    . sertraline (ZOLOFT) 100 MG tablet Take 100 mg by mouth daily.    . STUDY - ASPIRE - apixaban 5 mg or placebo tablet (PI-Sethi) Take 5 mg by mouth daily.    . traZODone (DESYREL) 150 MG tablet Take by mouth at bedtime.     Social History   Socioeconomic History  . Marital status: Married    Spouse name: Not on file  . Number of children: Not on file  . Years of education: Not on file  . Highest education level: Not on file  Occupational History  . Not on file  Social Needs  . Financial resource strain: Not on file  . Food insecurity    Worry: Not on file    Inability: Not on file  . Transportation needs    Medical: Not on file    Non-medical: Not on file  Tobacco Use  . Smoking  status: Never Smoker  Substance and Sexual Activity  . Alcohol use: Yes    Comment: occassional   . Drug use: Never  . Sexual activity: Not on file  Lifestyle  . Physical activity    Days per week: Not on file    Minutes per session: Not on file  . Stress: Not on file  Relationships  . Social Musician on phone: Not on file    Gets together: Not on file    Attends religious service: Not on file    Active member of club or organization: Not on file    Attends meetings of clubs or organizations: Not on file    Relationship status: Not on file  . Intimate partner violence    Fear of current or ex partner: Not on file    Emotionally abused: Not on file    Physically abused: Not on file    Forced sexual activity: Not on file   Other Topics Concern  . Not on file  Social History Narrative  . Not on file   History reviewed. No pertinent family history.  OBJECTIVE:  Vitals:   09/26/19 1048  BP: (!) 77/50  Pulse: (!) 59  Resp: 18  Temp: 98.1 F (36.7 C)  SpO2: 94%    BP rechecked: 85/ 55  General appearance: ALERT; in no acute distress.  Head: NCAT Lungs: Normal respiratory effort CV: Radial pulses 1-2+ . Cap refill < 3 seconds Musculoskeletal: Right Wrist Inspection: RT wrist with swelling and brawny skin changes from dorsal aspect of RT hand extending proximally to distal third of RT forearm; no open wounds or cuts; warm to the touch Palpation: Diffusely TTP over medial aspect of wrist ROM: LROM about the wrist Strength: deferred Skin: warm and dry Neurologic: Ambulates without difficulty; Sensation intact about the upper extremities Psychological: alert and cooperative; normal mood and affect  DIAGNOSTIC STUDIES:  Dg Wrist Complete Right  Result Date: 09/26/2019 CLINICAL DATA:  Right wrist pain since Monday. Swelling and redness. EXAM: RIGHT WRIST - COMPLETE 3+ VIEW COMPARISON:  None. FINDINGS: Degenerative changes mainly at the scaphoid articulation with the trapezoid and trapezium. No acute bony findings or destructive bony changes. Chondrocalcinosis is noted. The MCP joints are maintained. IMPRESSION: Degenerative changes and chondrocalcinosis but no acute bony findings. Electronically Signed   By: Rudie Meyer M.D.   On: 09/26/2019 11:19    X-rays negative for bony abnormalities including fracture, or dislocation.   I have reviewed the x-rays myself and the radiologist interpretation. I am in agreement with the radiologist interpretation.     ASSESSMENT & PLAN:  1. Pain and swelling of left wrist   2. Hypotension, unspecified hypotension type    No orders of the defined types were placed in this encounter.  Recommending further evaluation and management in the ED.  Cannot determine  cause of left wrist pain, swelling, skin changes, or hypotension.  Patient does not have history of hypotension, and last blood pressure on file was 129/63.  X-rays did not show fracture or dislocation.  Patient has been taking tramadol and clindamycin x 2 days without relief.  Concern for septic joint.  Patient in agreement with plan and will go to ED for further evaluation and management.       Rennis Harding, PA-C 09/26/19 1141

## 2019-09-26 NOTE — Progress Notes (Signed)
Pharmacy Antibiotic Note  Theresa Wong is a 78 y.o. female admitted on 09/26/2019 with cellulitis.  Pharmacy has been consulted for vancomycin and merrem dosing.  Plan: Vancomycin 1250mg  IV every 24 hours.  Goal trough 10-15 mcg/mL. merrem 1gm iv q12h  Height: 5' 3.5" (161.3 cm) Weight: 162 lb (73.5 kg) IBW/kg (Calculated) : 53.55  Temp (24hrs), Avg:97.4 F (36.3 C), Min:96.7 F (35.9 C), Max:98.1 F (36.7 C)  Recent Labs  Lab 09/26/19 1407  WBC 7.1  CREATININE 1.14*    Estimated Creatinine Clearance: 40.8 mL/min (A) (by C-G formula based on SCr of 1.14 mg/dL (H)).    Allergies  Allergen Reactions  . Penicillins Itching    Antimicrobials this admission: 10/30 merrem >>  10/30 vancomycin >>   Microbiology results: 10/30 BCx: sent 10/30 UCx: sent  10/30 Cerebrospinal Fluid from Synovial, Right Elbow: sent 10/30 Covid 19: sent   Thank you for allowing pharmacy to be a part of this patient's care.  Donna Christen Shamica Moree 09/26/2019 4:13 PM

## 2019-09-26 NOTE — ED Triage Notes (Signed)
Pt presents with right wrist swelling and pain , pt is unaware of injury . Pt was awoken in pain , unsure if she slept on it wrong or insect bite

## 2019-09-26 NOTE — ED Triage Notes (Signed)
Pt was seen at another urgent care on Wednesday and was given clindomycin at tramadol that has not been affective

## 2019-09-26 NOTE — H&P (Signed)
[facility] H&P   Patient Demographics:    Theresa Wong, is a 78 y.o. female  MRN: [id number]   DOB - 05-Jan-1941  Admit Date - 09/26/2019  Outpatient Primary MD for the patient is System, Pcp Not In  Referring MD/NP/PA: Dr Hyacinth Meeker  Patient coming from: Home  Chief Complaint  Patient presents with  . Wrist Pain      HPI:    Theresa Wong  is a 78 y.o. female, with past medical history of atrial fibrillation, hypertension, presents to ED secondary to complaints of wrist pain and swelling, her symptoms began 4 days ago, she recently visiting from [place], reports developed wrist pain and swelling on Monday, has been progressing, she has been treated with clindamycin for last 3 days with no improvement, reports erythema pain swelling spreading to her right elbow, pain is intermittent and achy, throbbing in character, worsened by movement, she denies fever, chills, focal deficits, tingling or numbness, cough or shortness of breath. - in ED was hypotensive systolic blood pressure in the 70s, as well hypothermic, with elevated creatinine at 0.14, potassium of 2.9, lactic acid still pending, she was started on vancomycin empirically, and I was called to admit for further evaluation as it appears she failed oral clindamycin as an outpatient.    Review of systems:    In addition to the HPI above, No Fever-chills, reports generalized weakness No Headache, No changes with Vision or hearing, No problems swallowing food or Liquids, No Chest pain, Cough or Shortness of Breath, No Abdominal pain, No Nausea or Vommitting, Bowel movements are regular, No Blood in stool or Urine, No dysuria, Right forearm cellulitis Planes of pain in right wrist and elbow area No new weakness, tingling, numbness in any extremity, No recent weight gain or loss, No polyuria, polydypsia or polyphagia, No significant Mental  Stressors.  A full 10 point Review of Systems was done, except as stated above, all other Review of Systems were negative.   With Past History of the following :    Past Medical History:  Diagnosis Date  . Atrial fibrillation ([facility])   . Hypertension   . Pneumonia       History reviewed. No pertinent surgical history.    Social History:     Social History   Tobacco Use  . Smoking status: Never Smoker  Substance Use Topics  . Alcohol use: Yes    Comment: occassional         Family History :   Family history significant for CAD in her father   Home Medications:   Prior to Admission medications   Medication Sig Start Date End Date Taking? Authorizing Provider  amLODipine (NORVASC) 5 MG tablet Take 5 mg by mouth daily.    [provider]  apixaban (ELIQUIS) 5 MG TABS tablet Take 5 mg by mouth 2 (two) times daily.    [provider]  busPIRone (BUSPAR) 15 MG tablet Take 15 mg by mouth daily.    [provider]  clindamycin (CLEOCIN) 300 MG capsule Take 1 capsule (300 mg total) by mouth 4 (four) times daily. X 7 days 05/26/14   Zadie RhineWickline, Donald, MD  hydrochlorothiazide (MICROZIDE) 12.5 MG capsule Take 12.5 mg by mouth daily.    [provider]  HYDROcodone-acetaminophen (NORCO/VICODIN) 5-325 MG tablet Take 1 tablet by mouth every 6 (six) hours as needed. 09/09/18   Benjiman CorePickering, Nathan, MD  metoprolol succinate (TOPROL-XL) 50 MG 24 hr tablet Take 50 mg by mouth 2 (two) times daily. Take with or immediately following a meal.    [provider]  Multiple Vitamins-Minerals (OCUVITE PRESERVISION) TABS Take 1 tablet by mouth daily.    [provider]  oxyCODONE-acetaminophen (PERCOCET/ROXICET) 5-325 MG per tablet Take 1 tablet by mouth every 4 (four) hours as needed for severe pain. 05/26/14   Zadie RhineWickline, Donald, MD  pantoprazole (PROTONIX) 40 MG tablet Take 40 mg by mouth daily.    [provider]  raloxifene (EVISTA) 60 MG  tablet Take 60 mg by mouth daily.    [provider]  sertraline (ZOLOFT) 100 MG tablet Take 100 mg by mouth daily.    [provider]  STUDY - ASPIRE - apixaban 5 mg or placebo tablet (PI-Sethi) Take 5 mg by mouth daily.    [provider]  traZODone (DESYREL) 150 MG tablet Take by mouth at bedtime.    [provider]     Allergies:     Allergies  Allergen Reactions  . Penicillins Itching     Physical Exam:   Vitals  Blood pressure (!) 111/48, pulse (!) 56, temperature (!) 96.7 F (35.9 C), temperature source Temporal, resp. rate 20, height 5' 3.5" (1.613 m), weight 73.5 kg, SpO2 94 %.   1. General developed female, laying in bed in no apparent distress.  2. Normal affect and insight, Not Suicidal or Homicidal, Awake Alert, Oriented X 3.  3. No F.N deficits, ALL C.Nerves Intact, Strength 5/5 all 4 extremities, Sensation intact all 4 extremities, Plantars down going.  4. Ears and Eyes appear Normal, Conjunctivae clear, PERRLA. Moist Oral Mucosa.  5. Supple Neck, No JVD, No cervical lymphadenopathy appriciated, No Carotid Bruits.  6. Symmetrical Chest wall movement, Good air movement bilaterally, CTAB.  7. RRR, No Gallops, Rubs or Murmurs, No Parasternal Heave.  8. Positive Bowel Sounds, Abdomen Soft, No tenderness, No organomegaly appriciated,No rebound -guarding or rigidity.  9.  No Cyanosis, Normal Skin Turgor, .  10. Good muscle tone, patient had right elbow with Ace wrap status post thoracentesis by ED physician, right wrist with erythema and swelling, tenderness to palpation, admitted range of motion due to pain.  11. No Palpable Lymph Nodes in Neck or Axillae  *   Data Review:    CBC Recent Labs  Lab 09/26/19 1407  WBC 7.1  HGB 11.8*  HCT 36.6  PLT 222  MCV 97.1  MCH 31.3  MCHC 32.2  RDW 13.8  LYMPHSABS 0.7  MONOABS 1.3*  EOSABS 0.0  BASOSABS 0.0    ------------------------------------------------------------------------------------------------------------------  Chemistries  Recent Labs  Lab 09/26/19 1407  NA 137  K 2.9*  CL 98  CO2 28  GLUCOSE 87  BUN 19  CREATININE 1.14*  CALCIUM 8.6*  AST 16  ALT 12  ALKPHOS 46  BILITOT 0.8   ------------------------------------------------------------------------------------------------------------------ estimated creatinine clearance is 40.8 mL/min (A) (by C-G formula based on SCr of 1.14 mg/dL (  H)). ------------------------------------------------------------------------------------------------------------------ No results for input(s): TSH, T4TOTAL, T3FREE, THYROIDAB in the last 72 hours.  Invalid input(s): FREET3  Coagulation profile Recent Labs  Lab 09/26/19 1407  INR 1.9*   ------------------------------------------------------------------------------------------------------------------- No results for input(s): DDIMER in the last 72 hours. -------------------------------------------------------------------------------------------------------------------  Cardiac Enzymes No results for input(s): CKMB, TROPONINI, MYOGLOBIN in the last 168 hours.  Invalid input(s): CK ------------------------------------------------------------------------------------------------------------------ No results found for: BNP   ---------------------------------------------------------------------------------------------------------------  Urinalysis No results found for: COLORURINE, APPEARANCEUR, LABSPEC, PHURINE, GLUCOSEU, HGBUR, BILIRUBINUR, KETONESUR, PROTEINUR, UROBILINOGEN, NITRITE, LEUKOCYTESUR  ----------------------------------------------------------------------------------------------------------------   Imaging Results:    Dg Wrist Complete Right  Result Date: 09/26/2019 CLINICAL DATA:  Right wrist pain since Monday. Swelling and redness. EXAM: RIGHT WRIST - COMPLETE 3+  VIEW COMPARISON:  None. FINDINGS: Degenerative changes mainly at the scaphoid articulation with the trapezoid and trapezium. No acute bony findings or destructive bony changes. Chondrocalcinosis is noted. The MCP joints are maintained. IMPRESSION: Degenerative changes and chondrocalcinosis but no acute bony findings. Electronically Signed   By: Marijo Sanes M.D.   On: 09/26/2019 11:19   Dg Chest Port 1 View  Result Date: 09/26/2019 CLINICAL DATA:  Per triage note: Pt sent to ED by Urgent Care in [place]. Pt was seen on Wednesday for right hand and wrist pain, placed on antibiotics. Pt followed up today bc redness and swelling was getting worse and sent here. Pt had xray today at Urgent Care. EXAM: PORTABLE CHEST 1 VIEW COMPARISON:  None. FINDINGS: Cardiac silhouette is mildly enlarged. No mediastinal or hilar masses. Linear and mild hazy opacities are noted in the right mid lung. Small linear opacities are noted at the left lung base. These areas are likely due to scarring. No convincing pneumonia. No evidence of pulmonary edema. No pleural effusion or pneumothorax. There are multiple vascular clips over the anterolateral chest falls and axilla consistent with bilateral breast surgery. Skeletal structures are grossly intact. IMPRESSION: No acute cardiopulmonary disease. Electronically Signed   By: Lajean Manes M.D.   On: 09/26/2019 14:45    My personal review of EKG: Rhythm A fib, Rate  110 /min, QTc 383, no Acute ST changes   Assessment & Plan:    Active Problems:   Unspecified atrial fibrillation (HCC)   Essential hypertension   Cellulitis   Right forearm cellulitis -Patient with worsening cellulitis in the right arm despite clindamycin, extending from wrist area to elbow area, status post arthrocentesis in the right elbow area by ED physician, will follow on Gram stain and sensitivity. -Continue with broad-spectrum antibiotic vancomycin and meropenem given lack of response to gentamicin  as an outpatient. -Low blood cultures. -Continue with IV fluids. -Distinct feature of cellulitis surrounding the elbow and the wrist area, unlikely underlying dermatologic disorders, but I will check procalcitonin to confirm bacterial infection, as well will check RA and anti-CCP to rule out rheumatoid arthritis.  Hypertension -Blood pressure low on presentation currently soft, hold meds, continue with IV fluids  AKI -Due to hypotension, continue with IV fluids  Hypokalemia -Repleted, recheck in a.m.  Atrial fibrillation -Continue with Eliquis, heart rate controlled, will resume metoprolol once patient is more stable.   DVT Prophylaxis on Eliquis-SCDs   AM Labs Ordered, also please review Full Orders  Family Communication: Admission, patients condition and plan of care including tests being ordered have been discussed with the patient  who indicate understanding and agree with the plan and Code Status.  Code Status full  Likely DC to home  Condition GUARDED  Consults called: None  Admission status:  Inpatient , as I do anticipate she will need antibiotics for more than 48 hours especially she failed outpatient clindamycin  Time spent in minutes : 60 minutes   Huey Bienenstock M.D on 09/26/2019 at 4:10 PM  Between 7am to 7pm - Pager - [phone]. After 7pm go to [email or web address] - password [email or web address]  [facility] - Office  [phone]

## 2019-09-26 NOTE — ED Triage Notes (Signed)
Pt sent to ED by Urgent Care in [place]. Pt was seen on Wednesday for right hand and wrist pain, placed on antibiotics. Pt followed up today bc redness and swelling was getting worse and sent here. Pt had xray today at Urgent Care.

## 2019-09-27 ENCOUNTER — Encounter (HOSPITAL_COMMUNITY): Payer: Self-pay

## 2019-09-27 DIAGNOSIS — I4891 Unspecified atrial fibrillation: Secondary | ICD-10-CM | POA: Diagnosis not present

## 2019-09-27 DIAGNOSIS — L03113 Cellulitis of right upper limb: Secondary | ICD-10-CM | POA: Diagnosis not present

## 2019-09-27 DIAGNOSIS — I10 Essential (primary) hypertension: Secondary | ICD-10-CM | POA: Diagnosis not present

## 2019-09-27 LAB — BASIC METABOLIC PANEL
Anion gap: 7 (ref 5–15)
BUN: 17 mg/dL (ref 8–23)
CO2: 27 mmol/L (ref 22–32)
Calcium: 8.3 mg/dL — ABNORMAL LOW (ref 8.9–10.3)
Chloride: 107 mmol/L (ref 98–111)
Creatinine, Ser: 0.79 mg/dL (ref 0.44–1.00)
GFR calc Af Amer: >60 mL/min (ref >60)
GFR calc non Af Amer: >60 mL/min (ref >60)
Glucose, Bld: 76 mg/dL (ref 70–99)
Potassium: 4 mmol/L (ref 3.5–5.1)
Sodium: 141 mmol/L (ref 135–145)

## 2019-09-27 LAB — CBC
HCT: 33.1 % — ABNORMAL LOW (ref 36.0–46.0)
Hemoglobin: 10.3 g/dL — ABNORMAL LOW (ref 12.0–15.0)
MCH: 30.7 pg (ref 26.0–34.0)
MCHC: 31.1 g/dL (ref 30.0–36.0)
MCV: 98.5 fL (ref 80.0–100.0)
Platelets: 196 10*3/uL (ref 150–400)
RBC: 3.36 MIL/uL — ABNORMAL LOW (ref 3.87–5.11)
RDW: 14 % (ref 11.5–15.5)
WBC: 4.3 10*3/uL (ref 4.0–10.5)
nRBC: 0 % (ref 0.0–0.2)

## 2019-09-27 LAB — PROCALCITONIN: Procalcitonin: <0.1 ng/mL

## 2019-09-27 MED ORDER — TRAZODONE HCL 50 MG PO TABS
150.0000 mg | ORAL_TABLET | Freq: Every day | ORAL | Status: DC
  Administered 2019-09-27: 150 mg via ORAL
  Filled 2019-09-27: qty 3

## 2019-09-27 MED ORDER — MONTELUKAST SODIUM 10 MG PO TABS
10.0000 mg | ORAL_TABLET | Freq: Every day | ORAL | Status: DC
  Administered 2019-09-27 – 2019-09-28 (×2): 10 mg via ORAL
  Filled 2019-09-27 (×2): qty 1

## 2019-09-27 MED ORDER — SERTRALINE HCL 50 MG PO TABS
100.0000 mg | ORAL_TABLET | Freq: Every day | ORAL | Status: DC
  Administered 2019-09-27 – 2019-09-28 (×2): 100 mg via ORAL
  Filled 2019-09-27 (×2): qty 2

## 2019-09-27 MED ORDER — BUSPIRONE HCL 5 MG PO TABS
15.0000 mg | ORAL_TABLET | Freq: Two times a day (BID) | ORAL | Status: DC
  Administered 2019-09-27 – 2019-09-28 (×2): 15 mg via ORAL
  Filled 2019-09-27 (×2): qty 3

## 2019-09-27 MED ORDER — OCUVITE-LUTEIN PO CAPS
1.0000 | ORAL_CAPSULE | Freq: Every day | ORAL | Status: DC
  Administered 2019-09-27 – 2019-09-28 (×2): 1 via ORAL
  Filled 2019-09-27 (×2): qty 1

## 2019-09-27 MED ORDER — FLECAINIDE ACETATE 50 MG PO TABS
50.0000 mg | ORAL_TABLET | Freq: Two times a day (BID) | ORAL | Status: DC
  Administered 2019-09-27 – 2019-09-28 (×2): 50 mg via ORAL
  Filled 2019-09-27 (×3): qty 1

## 2019-09-27 MED ORDER — SODIUM CHLORIDE 0.9 % IV SOLN
1.0000 g | Freq: Three times a day (TID) | INTRAVENOUS | Status: DC
  Administered 2019-09-27: 1 g via INTRAVENOUS
  Filled 2019-09-27: qty 1

## 2019-09-27 NOTE — Plan of Care (Signed)

## 2019-09-27 NOTE — Progress Notes (Signed)
PROGRESS NOTE    Theresa Wong  OZH:[id number] DOB: 06/17/1941 DOA: 09/26/2019 PCP: System, Pcp Not In     Brief Narrative:  78 y.o. female, with past medical history of atrial fibrillation, hypertension, presents to ED secondary to complaints of wrist pain and swelling, her symptoms began 4 days ago, she recently visiting from [place], reports developed wrist pain and swelling on Monday, has been progressing, she has been treated with clindamycin for last 3 days with no improvement, reports erythema pain swelling spreading to her right elbow, pain is intermittent and achy, throbbing in character, worsened by movement, she denies fever, chills, focal deficits, tingling or numbness, cough or shortness of breath. - in ED was hypotensive systolic blood pressure in the 70s, as well hypothermic, with elevated creatinine at 0.14, potassium of 2.9, lactic acid still pending, she was started on vancomycin empirically, and I was called to admit for further evaluation as it appears she failed oral clindamycin as an outpatient.   Assessment & Plan: 1-right wrist/forearm cellulitis -Follow culture results -Discontinue meropenem -Continue IV vancomycin -Extremity elevation and supportive care -Follow clinical response.  2-Unspecified atrial fibrillation (HCC) -Continue monitoring on telemetry -Resume the use of flecainide -Holding metoprolol (given soft blood pressure). -Continue Eliquis.  3-Essential hypertension -Blood pressure stable, but soft -Patient hypotensive on presentation -Continue following vital signs and encouraging low-sodium diet -Continue holding HCTZ, metoprolol and Norvasc.  4-gastroesophageal reflux disease -continue PPI.  5-anxiety/depression -No suicidal ideation currently -Mood is a stable -Continue the use of BuSpar, sertraline and trazodone.   DVT prophylaxis: Eliquis. Code Status: Full code Family Communication: Daughter over the phone. Disposition Plan:  Discontinue meropenem, continue vancomycin, follow clinical response.  Extremity elevation and supportive care.  Consultants:   None  Procedures:   See below for x-ray reports.  Antimicrobials:  Anti-infectives (From admission, onward)   Start     Dose/Rate Route Frequency Ordered Stop   09/27/19 1600  Vancomycin (VANCOCIN) 1,250 mg in sodium chloride 0.9 % 250 mL IVPB     1,250 mg 166.7 mL/hr over 90 Minutes Intravenous Every 24 hours 09/26/19 1612     09/27/19 1400  meropenem (MERREM) 1 g in sodium chloride 0.9 % 100 mL IVPB  Status:  Discontinued     1 g 200 mL/hr over 30 Minutes Intravenous Every 8 hours 09/27/19 0923 09/27/19 1700   09/26/19 1615  meropenem (MERREM) 1 g in sodium chloride 0.9 % 100 mL IVPB  Status:  Discontinued     1 g 200 mL/hr over 30 Minutes Intravenous Every 12 hours 09/26/19 1612 09/27/19 0923   09/26/19 1415  vancomycin (VANCOCIN) 1,500 mg in sodium chloride 0.9 % 500 mL IVPB     1,500 mg 250 mL/hr over 120 Minutes Intravenous  Once 09/26/19 1402 09/26/19 1755   09/26/19 1400  vancomycin (VANCOCIN) IVPB 1000 mg/200 mL premix  Status:  Discontinued     1,000 mg 200 mL/hr over 60 Minutes Intravenous  Once 09/26/19 1359 09/26/19 1402       Subjective: Afebrile, no chest pain, no shortness of breath, no palpitations.  Still complaining of swelling, erythematous changes decreased range of motion of her right forearm.  Objective: Vitals:   09/26/19 2231 09/27/19 0530 09/27/19 0834 09/27/19 1318  BP: (!) 102/46 (!) 125/55  (!) 130/53  Pulse: 68 70  64  Resp: 20 16  20   Temp: 99.1 F (37.3 C) 98.3 F (36.8 C)  98 F (36.7 C)  TempSrc: Oral   Oral  SpO2: 93% 97% 96% 94%  Weight:      Height:        Intake/Output Summary (Last 24 hours) at 09/27/2019 1702 Last data filed at 09/27/2019 1544 Gross per 24 hour  Intake 2958.24 ml  Output 950 ml  Net 2008.24 ml   Filed Weights   09/26/19 1208 09/26/19 1600 09/26/19 1957  Weight: 73.5 kg 68.9  kg 70.1 kg    Examination: General exam: Alert, awake, oriented x 3; denies chest pain, shortness of breath, lightheadedness and palpitations.  Patient is afebrile.  Still complaining of swelling and pain on her right forearm. Respiratory system: Clear to auscultation. Respiratory effort normal. Cardiovascular system:Rate controlled. No rubs, no gallops.  No JVD on exam. Gastrointestinal system: Abdomen is nondistended, soft and nontender. No organomegaly or masses felt. Normal bowel sounds heard. Central nervous system: Alert and oriented. No focal neurological deficits. Extremities: No cyanosis or clubbing. Skin: No open wounds on exam.  Positive fading erythematous changes unclear dorsal raise and forearm area.  Swelling appreciated affecting her wrist and right elbow.  Decreased range of motion because of pain and swelling.   Psychiatry: Judgement and insight appear normal. Mood & affect appropriate.     Data Reviewed: I have personally reviewed following labs and imaging studies  CBC: Recent Labs  Lab 09/26/19 1407 09/27/19 0612  WBC 7.1 4.3  NEUTROABS 5.1  --   HGB 11.8* 10.3*  HCT 36.6 33.1*  MCV 97.1 98.5  PLT 222 196   Basic Metabolic Panel: Recent Labs  Lab 09/26/19 1407 09/27/19 0612  NA 137 141  K 2.9* 4.0  CL 98 107  CO2 28 27  GLUCOSE 87 76  BUN 19 17  CREATININE 1.14* 0.79  CALCIUM 8.6* 8.3*   GFR: Estimated Creatinine Clearance: 56.2 mL/min (by C-G formula based on SCr of 0.79 mg/dL).   Liver Function Tests: Recent Labs  Lab 09/26/19 1407  AST 16  ALT 12  ALKPHOS 46  BILITOT 0.8  PROT 6.5  ALBUMIN 2.8*   Coagulation Profile: Recent Labs  Lab 09/26/19 1407  INR 1.9*    Recent Results (from the past 240 hour(s))  Blood Culture (routine x 2)     Status: None (Preliminary result)   Collection Time: 09/26/19  2:17 PM   Specimen: BLOOD LEFT ARM  Result Value Ref Range Status   Specimen Description BLOOD LEFT ARM  Final   Special Requests    Final    BOTTLES DRAWN AEROBIC AND ANAEROBIC Blood Culture results may not be optimal due to an excessive volume of blood received in culture bottles   Culture   Final    NO GROWTH < 24 HOURS Performed at [facility], [address] [place], [place] [address]    Report Status PENDING  Incomplete  Blood Culture (routine x 2)     Status: None (Preliminary result)   Collection Time: 09/26/19  2:48 PM   Specimen: BLOOD LEFT HAND  Result Value Ref Range Status   Specimen Description BLOOD LEFT HAND  Final   Special Requests   Final    BOTTLES DRAWN AEROBIC AND ANAEROBIC Blood Culture results may not be optimal due to an inadequate volume of blood received in culture bottles   Culture   Final    NO GROWTH < 24 HOURS Performed at [facility], [address] [place], [place] [address]    Report Status PENDING  Incomplete  Body fluid culture     Status: None (  Preliminary result)   Collection Time: 09/26/19  3:30 PM   Specimen: Synovium  Result Value Ref Range Status   Specimen Description   Final    SYNOVIAL Performed at [facility], [address] [place], [place] [address]    Special Requests   Final    NONE Performed at [facility], [address] [place], [place] [address]    Gram Stain   Final    ABUNDANT WBC PRESENT, PREDOMINANTLY PMN NO ORGANISMS SEEN    Culture   Final    NO GROWTH < 24 HOURS Performed at [facility] [address], [place], [place] [address]    Report Status PENDING  Incomplete  SARS CORONAVIRUS 2 (TAT 6-24 HRS) Nasopharyngeal Nasopharyngeal Swab     Status: None   Collection Time: 09/26/19  5:38 PM   Specimen: Nasopharyngeal Swab  Result Value Ref Range Status   SARS Coronavirus 2 NEGATIVE NEGATIVE Final    Comment: (NOTE) SARS-CoV-2 target nucleic acids are NOT DETECTED. The SARS-CoV-2 RNA is generally detectable in upper and lower respiratory specimens during the acute phase of infection. Negative results do not preclude SARS-CoV-2  infection, do not rule out co-infections with other pathogens, and should not be used as the sole basis for treatment or other patient management decisions. Negative results must be combined with clinical observations, patient history, and epidemiological information. The expected result is Negative. Fact Sheet for Patients: SugarRoll.be Fact Sheet for Healthcare Providers: https://www.woods-mathews.com/ This test is not yet approved or cleared by the [place] FDA and  has been authorized for detection and/or diagnosis of SARS-CoV-2 by FDA under an Emergency Use Authorization (EUA). This EUA will remain  in effect (meaning this test can be used) for the duration of the COVID-19 declaration under Section 56 4(b)(1) of the Act, 21 U.S.C. section 360bbb-3(b)(1), unless the authorization is terminated or revoked sooner. Performed at [facility], [place] [address] [place], [place] [address]      Radiology Studies: Dg Wrist Complete Right  Result Date: 09/26/2019 CLINICAL DATA:  Right wrist pain since Monday. Swelling and redness. EXAM: RIGHT WRIST - COMPLETE 3+ VIEW COMPARISON:  None. FINDINGS: Degenerative changes mainly at the scaphoid articulation with the trapezoid and trapezium. No acute bony findings or destructive bony changes. Chondrocalcinosis is noted. The MCP joints are maintained. IMPRESSION: Degenerative changes and chondrocalcinosis but no acute bony findings. Electronically Signed   By: Marijo Sanes M.D.   On: 09/26/2019 11:19   Dg Chest Port 1 View  Result Date: 09/26/2019 CLINICAL DATA:  Per triage note: Pt sent to ED by Urgent Care in [place]. Pt was seen on Wednesday for right hand and wrist pain, placed on antibiotics. Pt followed up today bc redness and swelling was getting worse and sent here. Pt had xray today at Urgent Care. EXAM: PORTABLE CHEST 1 VIEW COMPARISON:  None. FINDINGS: Cardiac silhouette is mildly  enlarged. No mediastinal or hilar masses. Linear and mild hazy opacities are noted in the right mid lung. Small linear opacities are noted at the left lung base. These areas are likely due to scarring. No convincing pneumonia. No evidence of pulmonary edema. No pleural effusion or pneumothorax. There are multiple vascular clips over the anterolateral chest falls and axilla consistent with bilateral breast surgery. Skeletal structures are grossly intact. IMPRESSION: No acute cardiopulmonary disease. Electronically Signed   By: Lajean Manes M.D.   On: 09/26/2019 14:45    Scheduled Meds: . apixaban  5  mg Oral BID  . busPIRone  15 mg Oral BID  . flecainide  50 mg Oral BID  . montelukast  10 mg Oral Daily  . multivitamin-lutein  1 capsule Oral Daily  . pantoprazole  40 mg Oral Daily  . raloxifene  60 mg Oral Daily  . sertraline  100 mg Oral Daily  . traZODone  150 mg Oral QHS   Continuous Infusions: . sodium chloride 1,000 mL (09/26/19 1453)  . sodium chloride Stopped (09/27/19 1446)  . vancomycin (VANCOCIN) 1250 mg/258mL IVPB (Vial-Mate [employer])       LOS: 1 day    Time spent: 35. Greater than 50% of this time was spent in direct contact with the patient, coordinating care and discussing relevant ongoing clinical issues, including the need for broad-spectrum antibiotics, ongoing cellulitis process and discussion about nocturnal course and expectation.  Family was updated over the phone at bedside and all questions were answered.Vassie Loll, MD [facility] Pager [phone]   09/27/2019, 5:02 PM

## 2019-09-28 DIAGNOSIS — L03113 Cellulitis of right upper limb: Secondary | ICD-10-CM

## 2019-09-28 DIAGNOSIS — K219 Gastro-esophageal reflux disease without esophagitis: Secondary | ICD-10-CM

## 2019-09-28 DIAGNOSIS — I480 Paroxysmal atrial fibrillation: Secondary | ICD-10-CM

## 2019-09-28 LAB — BASIC METABOLIC PANEL
Anion gap: 11 (ref 5–15)
BUN: 13 mg/dL (ref 8–23)
CO2: 22 mmol/L (ref 22–32)
Calcium: 8.3 mg/dL — ABNORMAL LOW (ref 8.9–10.3)
Chloride: 109 mmol/L (ref 98–111)
Creatinine, Ser: 0.65 mg/dL (ref 0.44–1.00)
GFR calc Af Amer: >60 mL/min (ref >60)
GFR calc non Af Amer: >60 mL/min (ref >60)
Glucose, Bld: 75 mg/dL (ref 70–99)
Potassium: 3.5 mmol/L (ref 3.5–5.1)
Sodium: 142 mmol/L (ref 135–145)

## 2019-09-28 LAB — CBC
HCT: 34.3 % — ABNORMAL LOW (ref 36.0–46.0)
Hemoglobin: 10.9 g/dL — ABNORMAL LOW (ref 12.0–15.0)
MCH: 31.1 pg (ref 26.0–34.0)
MCHC: 31.8 g/dL (ref 30.0–36.0)
MCV: 97.7 fL (ref 80.0–100.0)
Platelets: 211 10*3/uL (ref 150–400)
RBC: 3.51 MIL/uL — ABNORMAL LOW (ref 3.87–5.11)
RDW: 13.8 % (ref 11.5–15.5)
WBC: 2.4 10*3/uL — ABNORMAL LOW (ref 4.0–10.5)
nRBC: 0 % (ref 0.0–0.2)

## 2019-09-28 LAB — PROCALCITONIN: Procalcitonin: <0.1 ng/mL

## 2019-09-28 MED ORDER — FAMOTIDINE 40 MG PO TABS: 40.0000 mg | ORAL_TABLET | Freq: Every day | ORAL | Status: AC

## 2019-09-28 MED ORDER — OXYCODONE-ACETAMINOPHEN 5-325 MG PO TABS: 1.0000 | ORAL_TABLET | Freq: Three times a day (TID) | ORAL | 0 refills | Status: AC | PRN

## 2019-09-28 MED ORDER — DOXYCYCLINE HYCLATE 100 MG PO TABS: 100.0000 mg | ORAL_TABLET | Freq: Two times a day (BID) | ORAL | 0 refills | Status: DC

## 2019-09-28 MED ORDER — KETOROLAC TROMETHAMINE 30 MG/ML IJ SOLN
30.0000 mg | Freq: Once | INTRAMUSCULAR | Status: AC
  Administered 2019-09-28: 30 mg via INTRAVENOUS
  Filled 2019-09-28: qty 1

## 2019-09-28 NOTE — Progress Notes (Signed)
Nsg Discharge Note  Admit Date:  09/26/2019 Discharge date: 09/28/2019   Theresa Wong to be D/C'd Home per MD order.  AVS completed.  Copy for chart, and copy for patient signed, and dated. Removed IV-clean, dry, intact. Reviewed d/c paperwork with patient. Reviewed medication changes. Answered all questions. Wheeled stable patient and belongings to main entrance where she was picked up by daughter to d/c to home. Patient/caregiver able to verbalize understanding.  Discharge Medication: Allergies as of 09/28/2019      Reactions   Penicillins Itching      Medication List    STOP taking these medications   amLODipine 5 MG tablet Commonly known as: NORVASC   hydrochlorothiazide 12.5 MG capsule Commonly known as: MICROZIDE     TAKE these medications   busPIRone 15 MG tablet Commonly known as: BUSPAR Take 15 mg by mouth 2 (two) times daily.   doxycycline 100 MG tablet Commonly known as: VIBRA-TABS Take 1 tablet (100 mg total) by mouth 2 (two) times daily.   Eliquis 5 MG Tabs tablet Generic drug: apixaban Take 5 mg by mouth 2 (two) times daily. What changed: Another medication with the same name was removed. Continue taking this medication, and follow the directions you see here.   famotidine 40 MG tablet Commonly known as: PEPCID Take 1 tablet (40 mg total) by mouth at bedtime. What changed: when to take this   flecainide 50 MG tablet Commonly known as: TAMBOCOR Take 1 tablet by mouth 2 (two) times daily.   metoprolol succinate 50 MG 24 hr tablet Commonly known as: TOPROL-XL Take 50 mg by mouth 2 (two) times daily. Take with or immediately following a meal.   montelukast 10 MG tablet Commonly known as: SINGULAIR Take 1 tablet by mouth daily.   Ocuvite PreserVision Tabs Take 1 tablet by mouth daily.   oxyCODONE-acetaminophen 5-325 MG tablet Commonly known as: PERCOCET/ROXICET Take 1 tablet by mouth every 8 (eight) hours as needed for severe pain. What changed: when  to take this   pantoprazole 40 MG tablet Commonly known as: PROTONIX Take 40 mg by mouth daily.   raloxifene 60 MG tablet Commonly known as: EVISTA Take 60 mg by mouth daily.   sertraline 100 MG tablet Commonly known as: ZOLOFT Take 100 mg by mouth daily.   traZODone 150 MG tablet Commonly known as: DESYREL Take by mouth at bedtime.       Discharge Assessment: Vitals:   09/27/19 2058 09/28/19 0623  BP: 117/63 135/72  Pulse: 64 68  Resp: 20 16  Temp: 98.6 F (37 C) 98.4 F (36.9 C)  SpO2: 94% 92%   Skin clean, dry and intact without evidence of skin break down, no evidence of skin tears noted. IV catheter discontinued intact. Site without signs and symptoms of complications - no redness or edema noted at insertion site, patient denies c/o pain - only slight tenderness at site.  Dressing with slight pressure applied.  D/c Instructions-Education: Discharge instructions given to patient/family with verbalized understanding. D/c education completed with patient/family including follow up instructions, medication list, d/c activities limitations if indicated, with other d/c instructions as indicated by MD - patient able to verbalize understanding, all questions fully answered. Patient instructed to return to ED, call 911, or call MD for any changes in condition.  Patient escorted via [place], and D/C home via private auto.  Santa Lighter, RN 09/28/2019 5:32 PM

## 2019-09-28 NOTE — Discharge Summary (Signed)
Physician Discharge Summary  Theresa Wong WUJ:[id number] DOB: [birth year] DOA: 09/26/2019  PCP: System, Pcp Not In  Admit date: 09/26/2019 Discharge date: 09/28/2019  Time spent: 35 minutes  Recommendations for Outpatient Follow-up:  1. Repeat basic metabolic panel to follow electrolytes and renal function 2. Reassess blood pressure and adjust antihypertensive regimen as needed. 3. Reassess complete resolution patient right upper extremity cellulitis process.   Discharge Diagnoses:  Active Problems:   AF (paroxysmal atrial fibrillation) (HCC)   Essential hypertension   Cellulitis   Cellulitis of right wrist   Gastroesophageal reflux disease Essential hypertension.  Discharge Condition: Stable and improved.  Patient discharged home with instructions to follow-up with PCP in 14 days.  Diet recommendation: Heart healthy diet.  Filed Weights   09/26/19 1208 09/26/19 1600 09/26/19 1957  Weight: 73.5 kg 68.9 kg 70.1 kg    History of present illness:  As per H&P written by Dr. Randol Kern on 09/26/2019 78 y.o.female,with past medical history of atrial fibrillation, hypertension, presents to ED secondary to complaints of wrist pain and swelling, her symptoms began 4 days ago, she recently visiting from [place], reports developed wrist pain and swelling on Monday, has been progressing, she has been treated with clindamycin for last 3 days with no improvement, reports erythema pain swelling spreading to her right elbow, pain is intermittent and achy, throbbing in character, worsened by movement, she denies fever, chills, focal deficits, tingling or numbness, cough or shortness of breath. - in EDwas hypotensive systolic blood pressure in the 70s, as well hypothermic, with elevated creatinine at 0.14, potassium of 2.9, lactic acid still pending, she was started on vancomycin empirically, and I was called to admit for further evaluation as it appears she failed oral clindamycin as an  outpatient.  Hospital Course:  1-right wrist/forearm cellulitis -Follow final culture results -After 2 days of IV antibiotics patient has been discharge on doxycycline twice a day to complete antibiotic therapy. -Patient instructed to keep right upper extremity elevated as much as possible to assist with swelling.   -Follow up with PCP in 14 days.   2-chronic paroxysmal atrial fibrillation (HCC) -Telemetry findings demonstrating sinus rhythm currently. -Resume the use of Tambocor and metoprolol for rate control. -Continue Eliquis.  3-Essential hypertension -Blood pressure stable and well-controlled at time of discharge. -Patient was hypotensive on presentation -encouraged to follow low-sodium diet -Continue holding HCTZ and Norvasc at discharge. -Continue the use of metoprolol for blood pressure control.  4-gastroesophageal reflux disease -continue Protonix and nightly Pepcid..  5-anxiety/depression -No suicidal ideation or hallucinations currently -Her mood is a stable -Continue the use of BuSpar, sertraline and trazodone.  Procedures:  See below for x-ray reports.  Consultations:  None  Discharge Exam: Vitals:   09/27/19 2058 09/28/19 0623  BP: 117/63 135/72  Pulse: 64 68  Resp: 20 16  Temp: 98.6 F (37 C) 98.4 F (36.9 C)  SpO2: 94% 92%   General exam: Alert, awake, oriented x 3; denies chest pain, shortness of breath, lightheadedness or palpitations.  Patient is afebrile.  Still complaining of some swelling and pain on her right forearm/wrist; but significantly improved.Marland Kitchen Respiratory system: Clear to auscultation. Respiratory effort normal. Cardiovascular system:Rate controlled, sinus rhythm currently. No rubs, no gallops.  No JVD on exam. Gastrointestinal system: Abdomen is nondistended, soft and nontender. No organomegaly or masses felt. Normal bowel sounds heard. Central nervous system: Alert and oriented. No focal neurological deficits. Extremities: No  cyanosis or clubbing. Skin: No open wounds on exam.    Erythema  process has essentially resolved during today's examination; still having some swelling and discomfort with movement.  Overall significantly improved and ready to continue treatment as an outpatient.   Psychiatry: Judgement and insight appear normal. Mood & affect appropriate.    Discharge Instructions   Discharge Instructions    Diet - low sodium heart healthy   Complete by: As directed    Discharge instructions   Complete by: As directed    Maintain adequate hydration Take medications as prescribed Keep right upper extremity elevated as much as possible. Follow low sodium diet     Allergies as of 09/28/2019      Reactions   Penicillins Itching      Medication List    STOP taking these medications   amLODipine 5 MG tablet Commonly known as: NORVASC   hydrochlorothiazide 12.5 MG capsule Commonly known as: MICROZIDE     TAKE these medications   busPIRone 15 MG tablet Commonly known as: BUSPAR Take 15 mg by mouth 2 (two) times daily.   doxycycline 100 MG tablet Commonly known as: VIBRA-TABS Take 1 tablet (100 mg total) by mouth 2 (two) times daily.   Eliquis 5 MG Tabs tablet Generic drug: apixaban Take 5 mg by mouth 2 (two) times daily. What changed: Another medication with the same name was removed. Continue taking this medication, and follow the directions you see here.   famotidine 40 MG tablet Commonly known as: PEPCID Take 1 tablet (40 mg total) by mouth at bedtime. What changed: when to take this   flecainide 50 MG tablet Commonly known as: TAMBOCOR Take 1 tablet by mouth 2 (two) times daily.   metoprolol succinate 50 MG 24 hr tablet Commonly known as: TOPROL-XL Take 50 mg by mouth 2 (two) times daily. Take with or immediately following a meal.   montelukast 10 MG tablet Commonly known as: SINGULAIR Take 1 tablet by mouth daily.   Ocuvite PreserVision Tabs Take 1 tablet by mouth  daily.   oxyCODONE-acetaminophen 5-325 MG tablet Commonly known as: PERCOCET/ROXICET Take 1 tablet by mouth every 8 (eight) hours as needed for severe pain. What changed: when to take this   pantoprazole 40 MG tablet Commonly known as: PROTONIX Take 40 mg by mouth daily.   raloxifene 60 MG tablet Commonly known as: EVISTA Take 60 mg by mouth daily.   sertraline 100 MG tablet Commonly known as: ZOLOFT Take 100 mg by mouth daily.   traZODone 150 MG tablet Commonly known as: DESYREL Take by mouth at bedtime.      Allergies  Allergen Reactions  . Penicillins Itching    The results of significant diagnostics from this hospitalization (including imaging, microbiology, ancillary and laboratory) are listed below for reference.    Significant Diagnostic Studies: Dg Wrist Complete Right  Result Date: 09/26/2019 CLINICAL DATA:  Right wrist pain since Monday. Swelling and redness. EXAM: RIGHT WRIST - COMPLETE 3+ VIEW COMPARISON:  None. FINDINGS: Degenerative changes mainly at the scaphoid articulation with the trapezoid and trapezium. No acute bony findings or destructive bony changes. Chondrocalcinosis is noted. The MCP joints are maintained. IMPRESSION: Degenerative changes and chondrocalcinosis but no acute bony findings. Electronically Signed   By: Rudie Meyer M.D.   On: 09/26/2019 11:19   Dg Chest Port 1 View  Result Date: 09/26/2019 CLINICAL DATA:  Per triage note: Pt sent to ED by Urgent Care in [place]. Pt was seen on Wednesday for right hand and wrist pain, placed on antibiotics. Pt followed up today bc  redness and swelling was getting worse and sent here. Pt had xray today at Urgent Care. EXAM: PORTABLE CHEST 1 VIEW COMPARISON:  None. FINDINGS: Cardiac silhouette is mildly enlarged. No mediastinal or hilar masses. Linear and mild hazy opacities are noted in the right mid lung. Small linear opacities are noted at the left lung base. These areas are likely due to scarring.  No convincing pneumonia. No evidence of pulmonary edema. No pleural effusion or pneumothorax. There are multiple vascular clips over the anterolateral chest falls and axilla consistent with bilateral breast surgery. Skeletal structures are grossly intact. IMPRESSION: No acute cardiopulmonary disease. Electronically Signed   By: Amie Portlandavid  Ormond M.D.   On: 09/26/2019 14:45    Microbiology: Recent Results (from the past 240 hour(s))  Blood Culture (routine x 2)     Status: None (Preliminary result)   Collection Time: 09/26/19  2:17 PM   Specimen: BLOOD LEFT ARM  Result Value Ref Range Status   Specimen Description BLOOD LEFT ARM  Final   Special Requests   Final    BOTTLES DRAWN AEROBIC AND ANAEROBIC Blood Culture results may not be optimal due to an excessive volume of blood received in culture bottles   Culture   Final    NO GROWTH < 24 HOURS Performed at [facility], [address] [place], [place] [address]    Report Status PENDING  Incomplete  Blood Culture (routine x 2)     Status: None (Preliminary result)   Collection Time: 09/26/19  2:48 PM   Specimen: BLOOD LEFT HAND  Result Value Ref Range Status   Specimen Description BLOOD LEFT HAND  Final   Special Requests   Final    BOTTLES DRAWN AEROBIC AND ANAEROBIC Blood Culture results may not be optimal due to an inadequate volume of blood received in culture bottles   Culture   Final    NO GROWTH < 24 HOURS Performed at [facility], [address] [place], [place] [address]    Report Status PENDING  Incomplete  Body fluid culture     Status: None (Preliminary result)   Collection Time: 09/26/19  3:30 PM   Specimen: Synovium  Result Value Ref Range Status   Specimen Description   Final    SYNOVIAL Performed at [facility], [address] [place], [place] [address]    Special Requests   Final    NONE Performed at [facility], [address] [place], [place] [address]    Gram Stain   Final    ABUNDANT WBC PRESENT,  PREDOMINANTLY PMN NO ORGANISMS SEEN    Culture   Final    NO GROWTH 2 DAYS Performed at [facility], [address] [place], [place] [address]    Report Status PENDING  Incomplete  SARS CORONAVIRUS 2 (TAT 6-24 HRS) Nasopharyngeal Nasopharyngeal Swab     Status: None   Collection Time: 09/26/19  5:38 PM   Specimen: Nasopharyngeal Swab  Result Value Ref Range Status   SARS Coronavirus 2 NEGATIVE NEGATIVE Final    Comment: (NOTE) SARS-CoV-2 target nucleic acids are NOT DETECTED. The SARS-CoV-2 RNA is generally detectable in upper and lower respiratory specimens during the acute phase of infection. Negative results do not preclude SARS-CoV-2 infection, do not rule out co-infections with other pathogens, and should not be used as the sole basis for treatment or other patient management decisions. Negative results must be combined with clinical observations, patient history, and epidemiological information. The expected result is Negative.  Fact Sheet for Patients: SugarRoll.be Fact Sheet for Healthcare Providers: https://www.woods-mathews.com/ This test is not yet approved or cleared by the [place] FDA and  has been authorized for detection and/or diagnosis of SARS-CoV-2 by FDA under an Emergency Use Authorization (EUA). This EUA will remain  in effect (meaning this test can be used) for the duration of the COVID-19 declaration under Section 56 4(b)(1) of the Act, 21 U.S.C. section 360bbb-3(b)(1), unless the authorization is terminated or revoked sooner. Performed at [facility], [place] [address] [place],  [address]      Labs: Basic Metabolic Panel: Recent Labs  Lab 09/26/19 1407 09/27/19 0612 09/28/19 0631  NA 137 141 142  K 2.9* 4.0 3.5  CL 98 107 109  CO2 28 27 22   GLUCOSE 87 76 75  BUN 19 17 13   CREATININE 1.14* 0.79 0.65  CALCIUM 8.6* 8.3* 8.3*   Liver Function Tests: Recent Labs  Lab  09/26/19 1407  AST 16  ALT 12  ALKPHOS 46  BILITOT 0.8  PROT 6.5  ALBUMIN 2.8*   CBC: Recent Labs  Lab 09/26/19 1407 09/27/19 0612 09/28/19 0631  WBC 7.1 4.3 2.4*  NEUTROABS 5.1  --   --   HGB 11.8* 10.3* 10.9*  HCT 36.6 33.1* 34.3*  MCV 97.1 98.5 97.7  PLT 222 196 211    Signed:  Barton Dubois MD.  [facility] 09/28/2019, 1:48 PM

## 2019-09-29 LAB — BODY FLUID CULTURE: Culture: NO GROWTH

## 2019-09-30 LAB — CYCLIC CITRUL PEPTIDE ANTIBODY, IGG/IGA: CCP Antibodies IgG/IgA: 8 units (ref 0–19)

## 2019-09-30 LAB — RHEUMATOID FACTOR: Rheumatoid fact SerPl-aCnc: 10.8 IU/mL (ref 0.0–13.9)

## 2019-10-01 LAB — CULTURE, BLOOD (ROUTINE X 2)
Culture: NO GROWTH
Culture: NO GROWTH

## 2019-10-05 ENCOUNTER — Other Ambulatory Visit: Payer: Self-pay

## 2019-10-05 ENCOUNTER — Emergency Department (HOSPITAL_COMMUNITY)
Admission: EM | Admit: 2019-10-05 | Discharge: 2019-10-05 | Disposition: A | Discharge status: Home / Self Care | Payer: Medicare (Managed Care) | Attending: Emergency Medicine | Admitting: Emergency Medicine

## 2019-10-05 ENCOUNTER — Emergency Department (HOSPITAL_COMMUNITY): Payer: Medicare (Managed Care)

## 2019-10-05 DIAGNOSIS — L03113 Cellulitis of right upper limb: Secondary | ICD-10-CM | POA: Diagnosis present

## 2019-10-05 DIAGNOSIS — Z79899 Other long term (current) drug therapy: Secondary | ICD-10-CM | POA: Diagnosis not present

## 2019-10-05 DIAGNOSIS — J45909 Unspecified asthma, uncomplicated: Secondary | ICD-10-CM | POA: Insufficient documentation

## 2019-10-05 DIAGNOSIS — I10 Essential (primary) hypertension: Secondary | ICD-10-CM | POA: Diagnosis not present

## 2019-10-05 DIAGNOSIS — M159 Polyosteoarthritis, unspecified: Secondary | ICD-10-CM | POA: Diagnosis not present

## 2019-10-05 DIAGNOSIS — I480 Paroxysmal atrial fibrillation: Secondary | ICD-10-CM | POA: Insufficient documentation

## 2019-10-05 DIAGNOSIS — M130 Polyarthritis, unspecified: Secondary | ICD-10-CM

## 2019-10-05 LAB — CBC WITH DIFFERENTIAL/PLATELET
Abs Immature Granulocytes: 0.01 10*3/uL (ref 0.00–0.07)
Basophils Absolute: 0 10*3/uL (ref 0.0–0.1)
Basophils Relative: 1 %
Eosinophils Absolute: 0.2 10*3/uL (ref 0.0–0.5)
Eosinophils Relative: 4 %
HCT: 39.1 % (ref 36.0–46.0)
Hemoglobin: 12.4 g/dL (ref 12.0–15.0)
Immature Granulocytes: 0 %
Lymphocytes Relative: 16 %
Lymphs Abs: 0.7 10*3/uL (ref 0.7–4.0)
MCH: 30.8 pg (ref 26.0–34.0)
MCHC: 31.7 g/dL (ref 30.0–36.0)
MCV: 97.3 fL (ref 80.0–100.0)
Monocytes Absolute: 0.7 10*3/uL (ref 0.1–1.0)
Monocytes Relative: 16 %
Neutro Abs: 2.9 10*3/uL (ref 1.7–7.7)
Neutrophils Relative %: 63 %
Platelets: 285 10*3/uL (ref 150–400)
RBC: 4.02 MIL/uL (ref 3.87–5.11)
RDW: 13.4 % (ref 11.5–15.5)
WBC: 4.5 10*3/uL (ref 4.0–10.5)
nRBC: 0 % (ref 0.0–0.2)

## 2019-10-05 LAB — COMPREHENSIVE METABOLIC PANEL
ALT: 18 U/L (ref 0–44)
AST: 19 U/L (ref 15–41)
Albumin: 3 g/dL — ABNORMAL LOW (ref 3.5–5.0)
Alkaline Phosphatase: 55 U/L (ref 38–126)
Anion gap: 8 (ref 5–15)
BUN: 20 mg/dL (ref 8–23)
CO2: 28 mmol/L (ref 22–32)
Calcium: 8.8 mg/dL — ABNORMAL LOW (ref 8.9–10.3)
Chloride: 99 mmol/L (ref 98–111)
Creatinine, Ser: 0.83 mg/dL (ref 0.44–1.00)
GFR calc Af Amer: >60 mL/min (ref >60)
GFR calc non Af Amer: >60 mL/min (ref >60)
Glucose, Bld: 94 mg/dL (ref 70–99)
Potassium: 3.7 mmol/L (ref 3.5–5.1)
Sodium: 135 mmol/L (ref 135–145)
Total Bilirubin: 0.4 mg/dL (ref 0.3–1.2)
Total Protein: 6.3 g/dL — ABNORMAL LOW (ref 6.5–8.1)

## 2019-10-05 MED ORDER — DEXAMETHASONE SODIUM PHOSPHATE 10 MG/ML IJ SOLN
10.0000 mg | Freq: Once | INTRAMUSCULAR | Status: AC
  Administered 2019-10-05: 10 mg via INTRAMUSCULAR
  Filled 2019-10-05: qty 1

## 2019-10-05 MED ORDER — DOXYCYCLINE HYCLATE 100 MG PO CAPS: 100.0000 mg | ORAL_CAPSULE | Freq: Two times a day (BID) | ORAL | 0 refills | Status: AC

## 2019-10-05 MED ORDER — ONDANSETRON HCL 4 MG PO TABS
4.0000 mg | ORAL_TABLET | Freq: Once | ORAL | Status: AC
  Administered 2019-10-05: 4 mg via ORAL
  Filled 2019-10-05: qty 1

## 2019-10-05 MED ORDER — HYDROCODONE-ACETAMINOPHEN 5-325 MG PO TABS: 1.0000 | ORAL_TABLET | ORAL | 0 refills | Status: AC | PRN

## 2019-10-05 MED ORDER — DEXAMETHASONE 4 MG PO TABS: 4.0000 mg | ORAL_TABLET | Freq: Two times a day (BID) | ORAL | 0 refills | Status: AC

## 2019-10-05 MED ORDER — HYDROCODONE-ACETAMINOPHEN 5-325 MG PO TABS
1.0000 | ORAL_TABLET | Freq: Once | ORAL | Status: AC
  Administered 2019-10-05: 1 via ORAL
  Filled 2019-10-05: qty 1

## 2019-10-05 NOTE — ED Triage Notes (Signed)
Pt recently admitted and discharge for cellulitis of right hand/ wrist area. Pt on doxycycline, states infection and swelling were getting better but swelling started to reoccur last night. Pt denies numbness/tingling in right hand. Normal ROM. Pt taking percocet for pain.

## 2019-10-05 NOTE — ED Notes (Signed)
Pt here from out of town  Seen here for cellulitis last week rx for doxycycline which made better   since yesterday pain and swelling to her  r hand and forearm   Here for reeval

## 2019-10-05 NOTE — Discharge Instructions (Signed)
Your complete blood count is within normal limits.  Your chemistries are within normal limits.  You do not have a temperature elevation, or an elevation in your heart rate.  Your examination favors an exacerbation of inflammation or arthritis.  Please hold onto the prescription for the doxycycline for the next 2days.  If you notice red streaks going up your arm or high fever or worsening of the swelling you may start the antibiotic.  If not please hold onto the antibiotic.  Please use Tylenol every 4 hours for mild pain.  May use Norco for more severe pain.This medication may cause drowsiness. Please do not drink, drive, or participate in activity that requires concentration while taking this medication.  Please use Decadron 2 times daily with food for inflammation.  Please see your primary physician for follow-up and recheck.

## 2019-10-05 NOTE — ED Provider Notes (Signed)
[facility] EMERGENCY DEPARTMENT Provider Note   CSN: [id number] Arrival date & time: 10/05/19  1149     History   Chief Complaint Chief Complaint  Patient presents with   Cellulitis    HPI Theresa Wong is a 78 y.o. female.     Patient is a 78 year old female who presents to the emergency department with a complaint of swelling of her right hand.  The patient states that she was recently treated November 8 for cellulitis of the right hand and wrist.  The patient was placed on doxycycline.  Patient states that her hand and forearm got better.  On yesterday the patient that she started noticing swelling and pain of her wrist again.  She has not had any injury that she is aware of.  She has not seen any red streaking, but noticed some swelling of her wrist and the knuckles of her hand.  No fever or chills reported.  No nausea or vomiting noted.  She presents now for assistance with this issue.  The history is provided by the patient.    Past Medical History:  Diagnosis Date   Asthma    Atrial fibrillation [facility])    Dysrhythmia    Hypertension    Pneumonia     Patient Active Problem List   Diagnosis Date Noted   Cellulitis of right wrist    Gastroesophageal reflux disease    AF (paroxysmal atrial fibrillation) (HCC) 09/26/2019   Essential hypertension 09/26/2019   Cellulitis 09/26/2019    No past surgical history on file.   OB History   No obstetric history on file.      Home Medications    Prior to Admission medications   Medication Sig Start Date End Date Taking? Authorizing Provider  apixaban (ELIQUIS) 5 MG TABS tablet Take 5 mg by mouth 2 (two) times daily.    [provider]  busPIRone (BUSPAR) 15 MG tablet Take 15 mg by mouth 2 (two) times daily.     [provider]  doxycycline (VIBRA-TABS) 100 MG tablet Take 1 tablet (100 mg total) by mouth 2 (two) times daily. 09/28/19   Vassie Loll, MD  famotidine (PEPCID) 40 MG tablet  Take 1 tablet (40 mg total) by mouth at bedtime. 09/28/19   Vassie Loll, MD  flecainide (TAMBOCOR) 50 MG tablet Take 1 tablet by mouth 2 (two) times daily.     [provider]  metoprolol succinate (TOPROL-XL) 50 MG 24 hr tablet Take 50 mg by mouth 2 (two) times daily. Take with or immediately following a meal.    [provider]  montelukast (SINGULAIR) 10 MG tablet Take 1 tablet by mouth daily. 09/22/16   [provider]  Multiple Vitamins-Minerals (OCUVITE PRESERVISION) TABS Take 1 tablet by mouth daily.    [provider]  oxyCODONE-acetaminophen (PERCOCET/ROXICET) 5-325 MG tablet Take 1 tablet by mouth every 8 (eight) hours as needed for severe pain. 09/28/19   Vassie Loll, MD  pantoprazole (PROTONIX) 40 MG tablet Take 40 mg by mouth daily.    [provider]  raloxifene (EVISTA) 60 MG tablet Take 60 mg by mouth daily.    [provider]  sertraline (ZOLOFT) 100 MG tablet Take 100 mg by mouth daily.    [provider]  traZODone (DESYREL) 150 MG tablet Take by mouth at bedtime.    [provider]    Family History No family history on file.  Social History Social History   Tobacco Use  Smoking status: Never Smoker   Smokeless tobacco: Never Used  Substance Use Topics   Alcohol use: Not Currently    Frequency: Never    Comment: occassional    Drug use: Never     Allergies   Penicillins   Review of Systems Review of Systems  Constitutional: Negative for activity change and appetite change.  HENT: Negative for congestion, ear discharge, ear pain, facial swelling, nosebleeds, rhinorrhea, sneezing and tinnitus.   Eyes: Negative for photophobia, pain and discharge.  Respiratory: Negative for cough, choking, shortness of breath and wheezing.   Cardiovascular: Negative for chest pain, palpitations and leg swelling.  Gastrointestinal: Negative for abdominal pain, blood in stool, constipation,  diarrhea, nausea and vomiting.  Genitourinary: Negative for difficulty urinating, dysuria, flank pain, frequency and hematuria.  Musculoskeletal: Positive for arthralgias. Negative for back pain, gait problem, myalgias and neck pain.  Skin: Negative for color change, rash and wound.  Neurological: Negative for dizziness, seizures, syncope, facial asymmetry, speech difficulty, weakness and numbness.  Hematological: Negative for adenopathy. Does not bruise/bleed easily.  Psychiatric/Behavioral: Negative for agitation, confusion, hallucinations, self-injury and suicidal ideas. The patient is not nervous/anxious.      Physical Exam Updated Vital Signs BP (!) 149/63 (BP Location: Left Arm)    Pulse (!) 50    Temp 98.6 F (37 C) (Oral)    Resp 16    Ht  (1.6 m)    Wt 69.9 kg    SpO2 98%    BMI 27.28 kg/m   Physical Exam Vitals signs and nursing note reviewed.  Constitutional:      Appearance: She is well-developed. She is not toxic-appearing.  HENT:     Head: Normocephalic.     Right Ear: Tympanic membrane and external ear normal.     Left Ear: Tympanic membrane and external ear normal.  Eyes:     General: Lids are normal.     Pupils: Pupils are equal, round, and reactive to light.  Neck:     Musculoskeletal: Normal range of motion and neck supple.     Vascular: No carotid bruit.  Cardiovascular:     Rate and Rhythm: Normal rate and regular rhythm.     Pulses: Normal pulses.     Heart sounds: Normal heart sounds.  Pulmonary:     Effort: No respiratory distress.     Breath sounds: Normal breath sounds.  Abdominal:     General: Bowel sounds are normal.     Palpations: Abdomen is soft.     Tenderness: There is no abdominal tenderness. There is no guarding.  Musculoskeletal: Normal range of motion.     Comments: There is good range of motion of the right shoulder.  Good range of motion of the right elbow.  No hot joints either the shoulder or the elbow.  There is no swelling of  the right forearm.  No red streaks appreciated.  There is swelling of the wrist.  There is mild swelling of the right MP joint of the index and long finger.  The area of the swelling is warm, but not hot.  Good range of motion of the fingers.  Degenerative joint disease changes noted of the fingers as well, capillary refill is less than 2 seconds.  Lymphadenopathy:     Head:     Right side of head: No submandibular adenopathy.     Left side of head: No submandibular adenopathy.     Cervical: No cervical adenopathy.  Skin:  General: Skin is warm and dry.  Neurological:     Mental Status: She is alert and oriented to person, place, and time.     Cranial Nerves: No cranial nerve deficit.     Sensory: No sensory deficit.  Psychiatric:        Speech: Speech normal.      ED Treatments / Results  Labs (all labs ordered are listed, but only abnormal results are displayed) Labs Reviewed  COMPREHENSIVE METABOLIC PANEL - Abnormal; Notable for the following components:      Result Value   Calcium 8.8 (*)    Total Protein 6.3 (*)    Albumin 3.0 (*)    All other components within normal limits  CBC WITH DIFFERENTIAL/PLATELET    EKG None  Radiology Dg Wrist Complete Right  Result Date: 10/05/2019 CLINICAL DATA:  Pt recently admitted and discharge for cellulitis of right hand/ wrist area. Pt on doxycycline, states infection and swelling were getting better but swelling started to reoccur last night EXAM: RIGHT WRIST - COMPLETE 3+ VIEW COMPARISON:  Right wrist radiographs 09/26/2019 FINDINGS: No evidence of acute fracture or dislocation. Degenerative changes are mainly seen at the scaphoid articulation with the trapezoid and trapezium. Chondrocalcinosis is noted at the ulnocarpal joint. IMPRESSION: No acute finding in the right wrist. Electronically Signed   By: Audie Pinto M.D.   On: 10/05/2019 13:24    Procedures Procedures (including critical care time)  Medications Ordered in  ED Medications - No data to display   Initial Impression / Assessment and Plan / ED Course  I have reviewed the triage vital signs and the nursing notes.  Pertinent labs & imaging results that were available during my care of the patient were reviewed by me and considered in my medical decision making (see chart for details).          Final Clinical Impressions(s) / ED Diagnoses. MDM  Blood pressure slightly elevated at 146/76, otherwise vital signs within normal limits.  Pulse oximetry is 96% on room air.  Within normal limits by my interpretation.  Patient has not had any new injury or trauma to the right wrist or hand.  There is been no recent fever or chills noted.  The patient recently finished antibiotics for cellulitis in this area.  She now has some mild swelling again.  As noted above there is no temperature elevation, no pulse elevation, and on examination there is no red streaking or open wound.  The x-ray shows degenerative changes, but no other problems.  The complete blood count and basic metabolic panel are nonacute.  Case discussed with Dr. Laverta [place].  Patient is requesting a second round of antibiotics.  The examination favors exacerbation of degenerative joint disease.  Patient will be given a prescription, but asked to hold the prescription over the next couple of days.  Patient will be given medication to use for pain and a short course of steroid medication.  Patient is in agreement with this plan.   Final diagnoses:  Arthritis of multiple sites    ED Discharge Orders    None       Lily Kocher, Hershal Coria 10/06/19 2051    Margette Fast, MD 10/08/19 1015

## 2019-10-05 NOTE — ED Notes (Signed)
Pt request something to eat

## 2020-12-27 IMAGING — DX DG WRIST COMPLETE 3+V*R*
4 series · 4 of 4 positions shown · non-contrast
Comparison: Right wrist radiographs 09/26/2019

CLINICAL DATA: Pt recently admitted and discharge for cellulitis of
right hand/ wrist area. Pt on doxycycline, states infection and
swelling were getting better but swelling started to reoccur last
night

EXAM:
RIGHT WRIST - COMPLETE 3+ VIEW

[wrist pa]
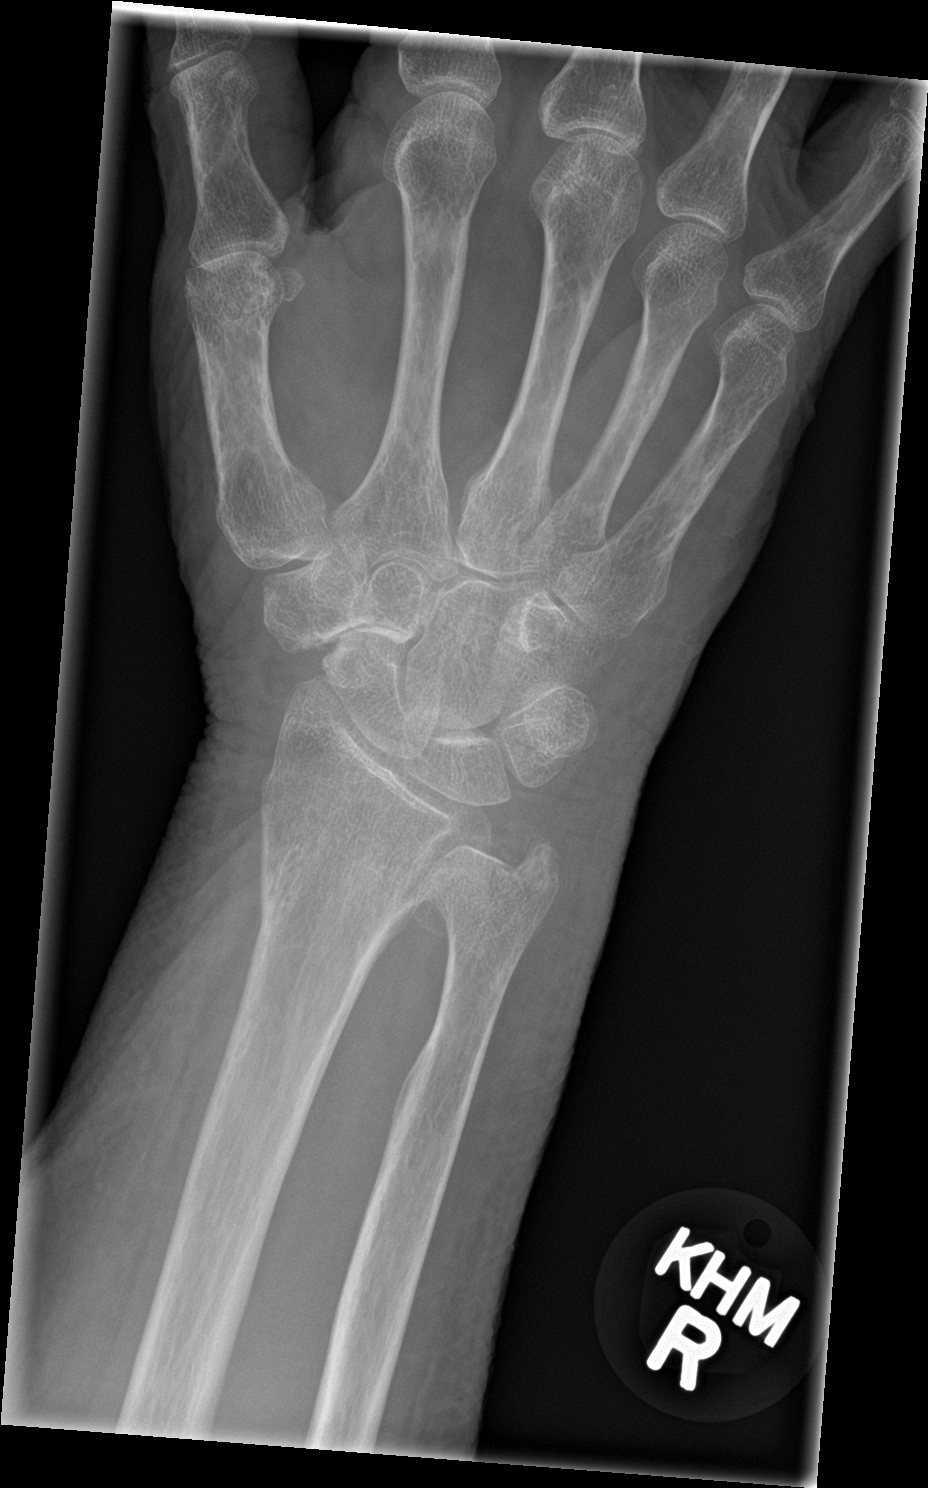

[wrist obl]
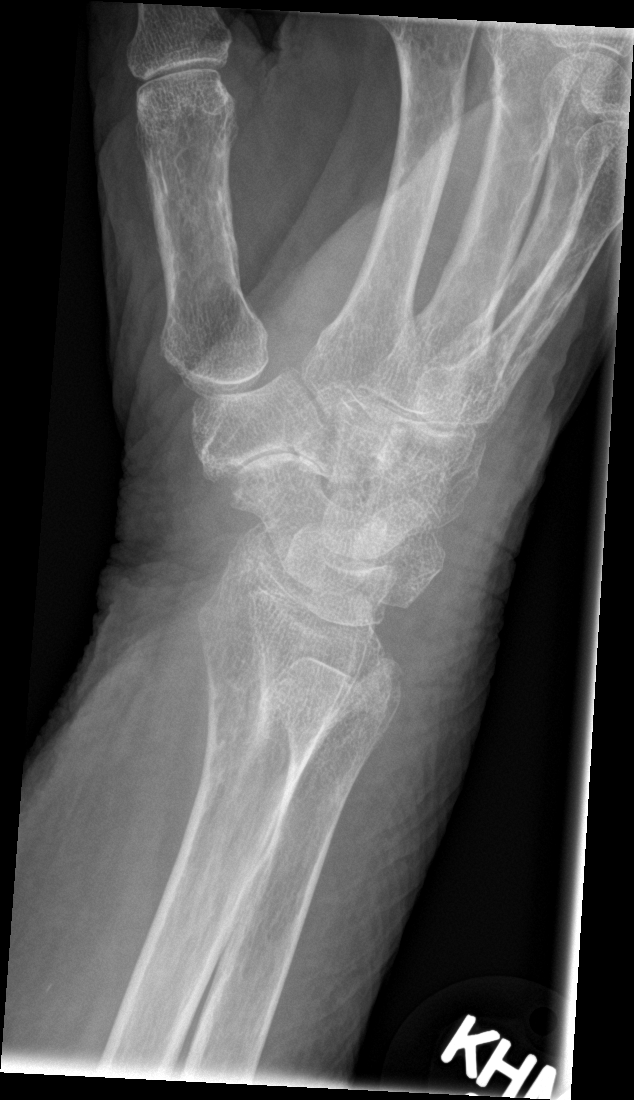

[wrist lat]
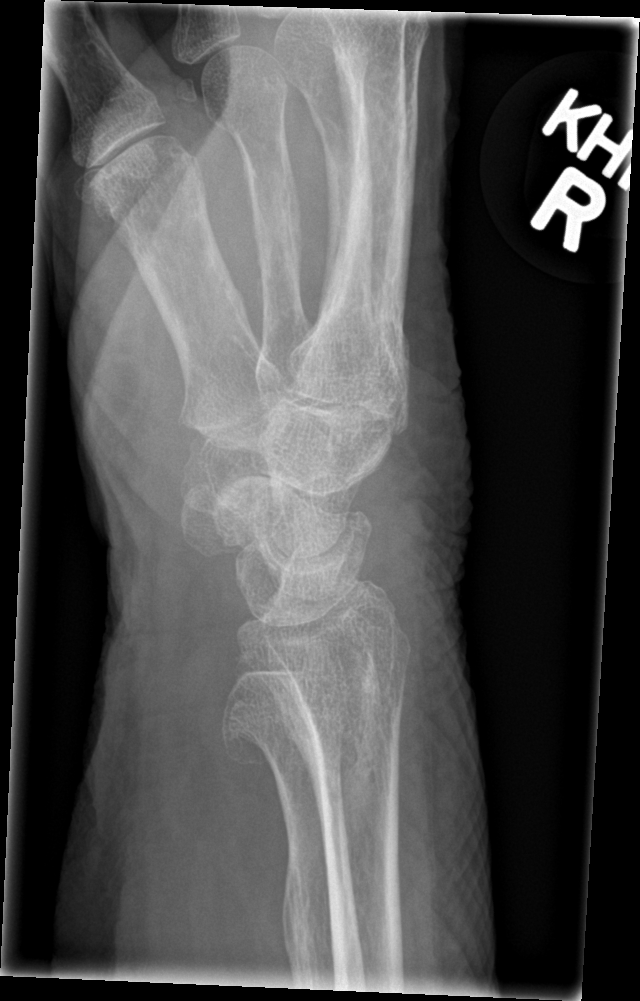

[wrist navicular]
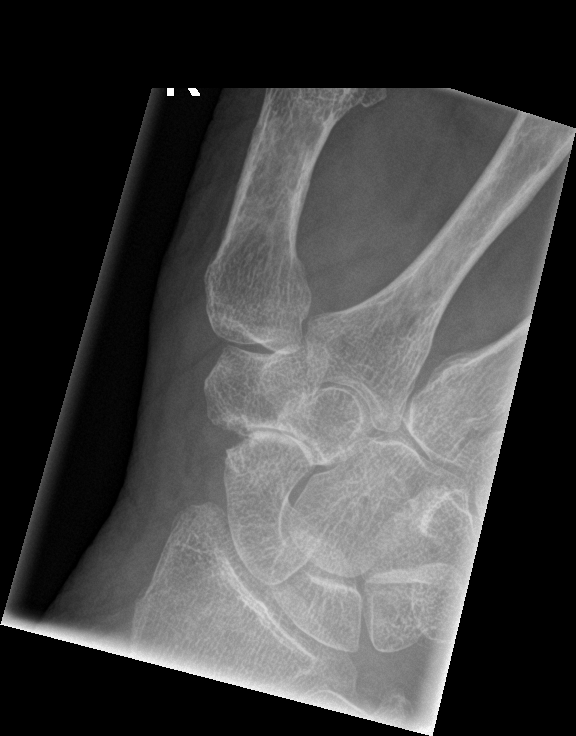

[4 of 4 positions shown; findings below may reference images not displayed]

FINDINGS: No evidence of acute fracture or dislocation. Degenerative changes
are mainly seen at the scaphoid articulation with the trapezoid and
trapezium. Chondrocalcinosis is noted at the ulnocarpal joint.
IMPRESSION: No acute finding in the right wrist.
